# Patient Record
Sex: Male | Born: 1975
Health system: Southern US, Community
[De-identification: ages and names within clinical notes are randomized; demographics above are authoritative.]

## PROBLEM LIST (undated history)

## (undated) DIAGNOSIS — M4310 Spondylolisthesis, site unspecified: Secondary | ICD-10-CM

## (undated) DIAGNOSIS — K219 Gastro-esophageal reflux disease without esophagitis: Secondary | ICD-10-CM

## (undated) DIAGNOSIS — F32A Depression, unspecified: Secondary | ICD-10-CM

## (undated) DIAGNOSIS — Z9989 Dependence on other enabling machines and devices: Secondary | ICD-10-CM

## (undated) DIAGNOSIS — M1990 Unspecified osteoarthritis, unspecified site: Secondary | ICD-10-CM

## (undated) DIAGNOSIS — I10 Essential (primary) hypertension: Secondary | ICD-10-CM

## (undated) DIAGNOSIS — G4733 Obstructive sleep apnea (adult) (pediatric): Secondary | ICD-10-CM

## (undated) DIAGNOSIS — F329 Major depressive disorder, single episode, unspecified: Secondary | ICD-10-CM

## (undated) HISTORY — PX: KNEE ARTHROSCOPY: SHX127

## (undated) HISTORY — PX: WISDOM TOOTH EXTRACTION: SHX21

## (undated) HISTORY — PX: TONSILLECTOMY: SUR1361

## (undated) HISTORY — DX: Spondylolisthesis, site unspecified: M43.10

## (undated) HISTORY — PX: MULTIPLE TOOTH EXTRACTIONS: SHX2053

## (undated) HISTORY — PX: OTHER SURGICAL HISTORY: SHX169

## (undated) HISTORY — DX: Obstructive sleep apnea (adult) (pediatric): G47.33

## (undated) HISTORY — DX: Gastro-esophageal reflux disease without esophagitis: K21.9

## (undated) HISTORY — DX: Essential (primary) hypertension: I10

## (undated) HISTORY — PX: VARICOCELE EXCISION: SUR582

## (undated) HISTORY — PX: CERVICAL SPINE SURGERY: SHX589

## (undated) HISTORY — PX: NASAL SEPTUM SURGERY: SHX37

## (undated) HISTORY — DX: Dependence on other enabling machines and devices: Z99.89

## (undated) HISTORY — PX: BACK SURGERY: SHX140

---

## 2000-02-26 ENCOUNTER — Ambulatory Visit: Admission: RE | Admit: 2000-02-26 | Discharge: 2000-02-26 | Payer: Self-pay | Admitting: Internal Medicine

## 2001-01-26 ENCOUNTER — Other Ambulatory Visit: Admission: RE | Admit: 2001-01-26 | Discharge: 2001-01-26 | Payer: Self-pay | Admitting: Otolaryngology

## 2003-10-01 ENCOUNTER — Ambulatory Visit (HOSPITAL_BASED_OUTPATIENT_CLINIC_OR_DEPARTMENT_OTHER): Admission: RE | Admit: 2003-10-01 | Discharge: 2003-10-01 | Payer: Self-pay | Admitting: Otolaryngology

## 2003-11-07 ENCOUNTER — Ambulatory Visit (HOSPITAL_COMMUNITY): Admission: RE | Admit: 2003-11-07 | Discharge: 2003-11-08 | Payer: Self-pay | Admitting: Otolaryngology

## 2005-03-16 ENCOUNTER — Ambulatory Visit (HOSPITAL_BASED_OUTPATIENT_CLINIC_OR_DEPARTMENT_OTHER): Admission: RE | Admit: 2005-03-16 | Discharge: 2005-03-16 | Payer: Self-pay | Admitting: Family Medicine

## 2005-03-21 ENCOUNTER — Ambulatory Visit: Payer: Self-pay | Admitting: Internal Medicine

## 2005-05-07 ENCOUNTER — Ambulatory Visit: Payer: Self-pay | Admitting: Pulmonary Disease

## 2005-05-18 ENCOUNTER — Ambulatory Visit (HOSPITAL_BASED_OUTPATIENT_CLINIC_OR_DEPARTMENT_OTHER): Admission: RE | Admit: 2005-05-18 | Discharge: 2005-05-18 | Payer: Self-pay | Admitting: Pulmonary Disease

## 2005-05-21 ENCOUNTER — Ambulatory Visit: Payer: Self-pay | Admitting: Pulmonary Disease

## 2005-07-20 ENCOUNTER — Ambulatory Visit: Payer: Self-pay | Admitting: Internal Medicine

## 2012-05-29 ENCOUNTER — Other Ambulatory Visit: Payer: Self-pay | Admitting: Occupational Medicine

## 2012-05-29 ENCOUNTER — Ambulatory Visit (HOSPITAL_BASED_OUTPATIENT_CLINIC_OR_DEPARTMENT_OTHER)
Admission: RE | Admit: 2012-05-29 | Discharge: 2012-05-29 | Disposition: A | Discharge status: Home / Self Care | Payer: Self-pay | Source: Ambulatory Visit | Attending: Occupational Medicine | Admitting: Occupational Medicine

## 2012-05-29 DIAGNOSIS — Z0000 Encounter for general adult medical examination without abnormal findings: Secondary | ICD-10-CM

## 2014-05-11 ENCOUNTER — Encounter: Payer: Self-pay | Admitting: *Deleted

## 2018-01-02 ENCOUNTER — Other Ambulatory Visit: Payer: Self-pay | Admitting: Orthopedic Surgery

## 2018-01-03 ENCOUNTER — Other Ambulatory Visit: Payer: Self-pay

## 2018-01-03 ENCOUNTER — Encounter (HOSPITAL_COMMUNITY): Payer: Self-pay | Admitting: *Deleted

## 2018-01-03 NOTE — Progress Notes (Signed)
Spoke with pt for pre-op call. Pt denies cardiac history or diabetes. States he had HTN "years ago" but no longer on medications.

## 2018-01-03 NOTE — Pre-Procedure Instructions (Addendum)
Juan Long  01/03/2018      [facility] - [place] - [address] [address] [place] [address] Phone: [phone] Fax: [phone]    Your procedure is scheduled on Thursday 01/05/2018.  Report to [facility] Admitting at 0530 A.M.  Call this number if you have problems the morning of surgery:  [phone]   Remember:  Do not eat food or drink liquids after midnight.  Take these medicines the morning of surgery with A SIP OF WATER: Bupropion (Wellbutrin XL) Escitalopram (Lexapro)  7 days prior to surgery STOP taking any Aspirin (unless otherwise instructed by your surgeon), Aleve, Naproxen, Ibuprofen, Motrin, Advil, Goody's, BC's, all herbal medications, fish oil, and all vitamins    Do not wear jewelry.  Do not wear lotions, powders, or colognes, or deodorant.  Men may shave face and neck.    Do not bring valuables to the hospital.  [facility] is not responsible for any belongings or valuables.  Hearing aids, eyeglasses, contacts, dentures or bridgework may not be worn into surgery.  Leave your suitcase in the car.  After surgery it may be brought to your room.  For patients admitted to the hospital, discharge time will be determined by your treatment team.  Patients discharged the day of surgery will not be allowed to drive home.   Name and phone number of your driver:    Special instructions:   Juan Long- Preparing For Surgery  Before surgery, you can play an important role. Because skin is not sterile, your skin needs to be as free of germs as possible. You can reduce the number of germs on your skin by washing with CHG (chlorahexidine gluconate) Soap before surgery.  CHG is an antiseptic cleaner which kills germs and bonds with the skin to continue killing germs even after washing.  Please do not use if you have an allergy to CHG or antibacterial soaps. If your skin becomes  reddened/irritated stop using the CHG.  Do not shave (including legs and underarms) for at least 48 hours prior to first CHG shower. It is OK to shave your face.  Please follow these instructions carefully.   1. Shower the NIGHT BEFORE SURGERY and the MORNING OF SURGERY with CHG.   2. If you chose to wash your hair, wash your hair first as usual with your normal shampoo.  3. After you shampoo, rinse your hair and body thoroughly to remove the shampoo.  4. Use CHG as you would any other liquid soap. You can apply CHG directly to the skin and wash gently with a scrungie or a clean washcloth.   5. Apply the CHG Soap to your body ONLY FROM THE NECK DOWN.  Do not use on open wounds or open sores. Avoid contact with your eyes, ears, mouth and genitals (private parts). Wash Face and genitals (private parts)  with your normal soap.  6. Wash thoroughly, paying special attention to the area where your surgery will be performed.  7. Thoroughly rinse your body with warm water from the neck down.  8. DO NOT shower/wash with your normal soap after using and rinsing off the CHG Soap.  9. Pat yourself dry with a CLEAN TOWEL.  10. Wear CLEAN PAJAMAS to bed the night before surgery, wear comfortable clothes the morning of surgery  11. Place CLEAN SHEETS on your bed the night of your first shower and DO NOT SLEEP WITH  PETS.    Day of Surgery: Shower as stated above. Do not apply any deodorants/lotions. Please wear clean clothes to the hospital/surgery center.      Please read over the following fact sheets that you were given.

## 2018-01-04 ENCOUNTER — Observation Stay (HOSPITAL_COMMUNITY)
Admission: RE | Admit: 2018-01-04 | Discharge: 2018-01-05 | Disposition: A | Discharge status: Home / Self Care | Payer: No Typology Code available for payment source | Source: Ambulatory Visit | Attending: Orthopedic Surgery | Admitting: Orthopedic Surgery

## 2018-01-04 ENCOUNTER — Inpatient Hospital Stay (HOSPITAL_COMMUNITY): Payer: No Typology Code available for payment source

## 2018-01-04 ENCOUNTER — Inpatient Hospital Stay (HOSPITAL_COMMUNITY): Payer: No Typology Code available for payment source | Admitting: Anesthesiology

## 2018-01-04 ENCOUNTER — Ambulatory Visit (HOSPITAL_COMMUNITY)
Admission: RE | Disposition: A | Discharge status: Home / Self Care | Payer: Self-pay | Source: Ambulatory Visit | Attending: Orthopedic Surgery

## 2018-01-04 ENCOUNTER — Inpatient Hospital Stay (HOSPITAL_COMMUNITY)
Admission: RE | Admit: 2018-01-04 | Discharge: 2018-01-04 | Disposition: A | Discharge status: Home / Self Care | Payer: Self-pay | Source: Ambulatory Visit

## 2018-01-04 ENCOUNTER — Encounter (HOSPITAL_COMMUNITY): Payer: Self-pay | Admitting: *Deleted

## 2018-01-04 DIAGNOSIS — K219 Gastro-esophageal reflux disease without esophagitis: Secondary | ICD-10-CM | POA: Insufficient documentation

## 2018-01-04 DIAGNOSIS — Z79899 Other long term (current) drug therapy: Secondary | ICD-10-CM | POA: Diagnosis not present

## 2018-01-04 DIAGNOSIS — I10 Essential (primary) hypertension: Secondary | ICD-10-CM | POA: Insufficient documentation

## 2018-01-04 DIAGNOSIS — G9529 Other cord compression: Secondary | ICD-10-CM | POA: Insufficient documentation

## 2018-01-04 DIAGNOSIS — Z419 Encounter for procedure for purposes other than remedying health state, unspecified: Secondary | ICD-10-CM

## 2018-01-04 DIAGNOSIS — Z9989 Dependence on other enabling machines and devices: Secondary | ICD-10-CM | POA: Diagnosis not present

## 2018-01-04 DIAGNOSIS — F329 Major depressive disorder, single episode, unspecified: Secondary | ICD-10-CM | POA: Insufficient documentation

## 2018-01-04 DIAGNOSIS — M4802 Spinal stenosis, cervical region: Secondary | ICD-10-CM | POA: Diagnosis not present

## 2018-01-04 DIAGNOSIS — M5412 Radiculopathy, cervical region: Secondary | ICD-10-CM | POA: Diagnosis not present

## 2018-01-04 DIAGNOSIS — Z87891 Personal history of nicotine dependence: Secondary | ICD-10-CM | POA: Diagnosis not present

## 2018-01-04 DIAGNOSIS — Z01818 Encounter for other preprocedural examination: Secondary | ICD-10-CM

## 2018-01-04 DIAGNOSIS — G4733 Obstructive sleep apnea (adult) (pediatric): Secondary | ICD-10-CM | POA: Diagnosis not present

## 2018-01-04 DIAGNOSIS — M1990 Unspecified osteoarthritis, unspecified site: Secondary | ICD-10-CM | POA: Diagnosis not present

## 2018-01-04 DIAGNOSIS — M5410 Radiculopathy, site unspecified: Secondary | ICD-10-CM | POA: Diagnosis present

## 2018-01-04 HISTORY — PX: ANTERIOR CERVICAL DECOMP/DISCECTOMY FUSION: SHX1161

## 2018-01-04 HISTORY — DX: Major depressive disorder, single episode, unspecified: F32.9

## 2018-01-04 HISTORY — DX: Depression, unspecified: F32.A

## 2018-01-04 LAB — COMPREHENSIVE METABOLIC PANEL
ALT: 91 U/L — ABNORMAL HIGH (ref 17–63)
AST: 41 U/L (ref 15–41)
Albumin: 4 g/dL (ref 3.5–5.0)
Alkaline Phosphatase: 70 U/L (ref 38–126)
Anion gap: 10 (ref 5–15)
BUN: 12 mg/dL (ref 6–20)
CO2: 20 mmol/L — ABNORMAL LOW (ref 22–32)
Calcium: 9.1 mg/dL (ref 8.9–10.3)
Chloride: 107 mmol/L (ref 101–111)
Creatinine, Ser: 0.97 mg/dL (ref 0.61–1.24)
GFR calc Af Amer: >60 mL/min (ref >60)
GFR calc non Af Amer: >60 mL/min (ref >60)
Glucose, Bld: 96 mg/dL (ref 65–99)
Potassium: 4.1 mmol/L (ref 3.5–5.1)
Sodium: 137 mmol/L (ref 135–145)
Total Bilirubin: 0.5 mg/dL (ref 0.3–1.2)
Total Protein: 6.4 g/dL — ABNORMAL LOW (ref 6.5–8.1)

## 2018-01-04 LAB — URINALYSIS, ROUTINE W REFLEX MICROSCOPIC
Bilirubin Urine: NEGATIVE
Glucose, UA: NEGATIVE mg/dL
Hgb urine dipstick: NEGATIVE
Ketones, ur: NEGATIVE mg/dL
Leukocytes, UA: NEGATIVE
Nitrite: NEGATIVE
Protein, ur: NEGATIVE mg/dL
Specific Gravity, Urine: 1.014 (ref 1.005–1.030)
pH: 6 (ref 5.0–8.0)

## 2018-01-04 LAB — PROTIME-INR
INR: 0.92
Prothrombin Time: 12.3 seconds (ref 11.4–15.2)

## 2018-01-04 LAB — CBC WITH DIFFERENTIAL/PLATELET
Basophils Absolute: 0 10*3/uL (ref 0.0–0.1)
Basophils Relative: 0 %
Eosinophils Absolute: 0.1 10*3/uL (ref 0.0–0.7)
Eosinophils Relative: 2 %
HCT: 43.3 % (ref 39.0–52.0)
Hemoglobin: 14.7 g/dL (ref 13.0–17.0)
Lymphocytes Relative: 37 %
Lymphs Abs: 1.8 10*3/uL (ref 0.7–4.0)
MCH: 30.7 pg (ref 26.0–34.0)
MCHC: 33.9 g/dL (ref 30.0–36.0)
MCV: 90.4 fL (ref 78.0–100.0)
Monocytes Absolute: 0.3 10*3/uL (ref 0.1–1.0)
Monocytes Relative: 6 %
Neutro Abs: 2.7 10*3/uL (ref 1.7–7.7)
Neutrophils Relative %: 55 %
Platelets: 316 10*3/uL (ref 150–400)
RBC: 4.79 MIL/uL (ref 4.22–5.81)
RDW: 13.8 % (ref 11.5–15.5)
WBC: 4.8 10*3/uL (ref 4.0–10.5)

## 2018-01-04 LAB — APTT: aPTT: 32 seconds (ref 24–36)

## 2018-01-04 SURGERY — ANTERIOR CERVICAL DECOMPRESSION/DISCECTOMY FUSION 2 LEVELS
Anesthesia: General | Laterality: Right

## 2018-01-04 MED ORDER — POVIDONE-IODINE 7.5 % EX SOLN: Freq: Once | CUTANEOUS | Status: DC

## 2018-01-04 MED ORDER — BUPROPION HCL ER (XL) 150 MG PO TB24
450.0000 mg | ORAL_TABLET | Freq: Every day | ORAL | Status: DC
  Filled 2018-01-04: qty 1

## 2018-01-04 MED ORDER — FENTANYL CITRATE (PF) 100 MCG/2ML IJ SOLN
INTRAMUSCULAR | Status: DC | PRN
  Administered 2018-01-04: 100 ug via INTRAVENOUS
  Administered 2018-01-04: 50 ug via INTRAVENOUS
  Administered 2018-01-04: 100 ug via INTRAVENOUS
  Administered 2018-01-04 (×2): 50 ug via INTRAVENOUS

## 2018-01-04 MED ORDER — PANTOPRAZOLE SODIUM 20 MG PO TBEC
20.0000 mg | DELAYED_RELEASE_TABLET | Freq: Every day | ORAL | Status: DC
  Administered 2018-01-04 – 2018-01-05 (×2): 20 mg via ORAL
  Filled 2018-01-04 (×2): qty 1

## 2018-01-04 MED ORDER — ONDANSETRON HCL 4 MG/2ML IJ SOLN: 4.0000 mg | Freq: Four times a day (QID) | INTRAMUSCULAR | Status: DC | PRN

## 2018-01-04 MED ORDER — PROPOFOL 10 MG/ML IV BOLUS
INTRAVENOUS | Status: DC | PRN
  Administered 2018-01-04: 200 mg via INTRAVENOUS

## 2018-01-04 MED ORDER — 0.9 % SODIUM CHLORIDE (POUR BTL) OPTIME
TOPICAL | Status: DC | PRN
  Administered 2018-01-04: 1000 mL

## 2018-01-04 MED ORDER — ACETAMINOPHEN 650 MG RE SUPP: 650.0000 mg | RECTAL | Status: DC | PRN

## 2018-01-04 MED ORDER — THROMBIN 5000 UNITS EX SOLR
CUTANEOUS | Status: DC | PRN
  Administered 2018-01-04: 5000 [IU] via TOPICAL

## 2018-01-04 MED ORDER — ONDANSETRON HCL 4 MG/2ML IJ SOLN
INTRAMUSCULAR | Status: AC
  Filled 2018-01-04: qty 2

## 2018-01-04 MED ORDER — LIDOCAINE HCL (CARDIAC) PF 100 MG/5ML IV SOSY
PREFILLED_SYRINGE | INTRAVENOUS | Status: DC | PRN
  Administered 2018-01-04: 100 mg via INTRAVENOUS

## 2018-01-04 MED ORDER — EPHEDRINE 5 MG/ML INJ
INTRAVENOUS | Status: AC
  Filled 2018-01-04: qty 10

## 2018-01-04 MED ORDER — DOCUSATE SODIUM 100 MG PO CAPS
100.0000 mg | ORAL_CAPSULE | Freq: Two times a day (BID) | ORAL | Status: DC
  Administered 2018-01-04 – 2018-01-05 (×2): 100 mg via ORAL
  Filled 2018-01-04 (×2): qty 1

## 2018-01-04 MED ORDER — THROMBIN 5000 UNITS EX SOLR
CUTANEOUS | Status: AC
  Filled 2018-01-04: qty 5000

## 2018-01-04 MED ORDER — FENTANYL CITRATE (PF) 250 MCG/5ML IJ SOLN
INTRAMUSCULAR | Status: AC
  Filled 2018-01-04: qty 5

## 2018-01-04 MED ORDER — BUPIVACAINE-EPINEPHRINE (PF) 0.25% -1:200000 IJ SOLN
INTRAMUSCULAR | Status: AC
  Filled 2018-01-04: qty 30

## 2018-01-04 MED ORDER — DEXAMETHASONE SODIUM PHOSPHATE 10 MG/ML IJ SOLN
INTRAMUSCULAR | Status: AC
  Filled 2018-01-04: qty 1

## 2018-01-04 MED ORDER — MENTHOL 3 MG MT LOZG: 1.0000 | LOZENGE | OROMUCOSAL | Status: DC | PRN

## 2018-01-04 MED ORDER — LACTATED RINGERS IV SOLN
INTRAVENOUS | Status: DC
  Administered 2018-01-04 (×2): via INTRAVENOUS

## 2018-01-04 MED ORDER — SUGAMMADEX SODIUM 200 MG/2ML IV SOLN
INTRAVENOUS | Status: AC
  Filled 2018-01-04: qty 2

## 2018-01-04 MED ORDER — FLEET ENEMA 7-19 GM/118ML RE ENEM: 1.0000 | ENEMA | Freq: Once | RECTAL | Status: DC | PRN

## 2018-01-04 MED ORDER — PHENOL 1.4 % MT LIQD: 1.0000 | OROMUCOSAL | Status: DC | PRN

## 2018-01-04 MED ORDER — SUGAMMADEX SODIUM 200 MG/2ML IV SOLN
INTRAVENOUS | Status: DC | PRN
  Administered 2018-01-04: 200 mg via INTRAVENOUS

## 2018-01-04 MED ORDER — SODIUM CHLORIDE 0.9% FLUSH: 3.0000 mL | INTRAVENOUS | Status: DC | PRN

## 2018-01-04 MED ORDER — THROMBIN (RECOMBINANT) 20000 UNITS EX SOLR
CUTANEOUS | Status: DC | PRN
  Administered 2018-01-04: 12:00:00 via TOPICAL

## 2018-01-04 MED ORDER — MEPERIDINE HCL 50 MG/ML IJ SOLN: 6.2500 mg | INTRAMUSCULAR | Status: DC | PRN

## 2018-01-04 MED ORDER — DEXAMETHASONE SODIUM PHOSPHATE 10 MG/ML IJ SOLN
INTRAMUSCULAR | Status: DC | PRN
  Administered 2018-01-04: 5 mg via INTRAVENOUS

## 2018-01-04 MED ORDER — EPHEDRINE SULFATE-NACL 50-0.9 MG/10ML-% IV SOSY
PREFILLED_SYRINGE | INTRAVENOUS | Status: DC | PRN
  Administered 2018-01-04: 5 mg via INTRAVENOUS

## 2018-01-04 MED ORDER — CEFAZOLIN SODIUM-DEXTROSE 2-4 GM/100ML-% IV SOLN
2.0000 g | INTRAVENOUS | Status: AC
  Administered 2018-01-04: 2 g via INTRAVENOUS
  Filled 2018-01-04: qty 100

## 2018-01-04 MED ORDER — ACETAMINOPHEN 325 MG PO TABS: 650.0000 mg | ORAL_TABLET | ORAL | Status: DC | PRN

## 2018-01-04 MED ORDER — ESCITALOPRAM OXALATE 10 MG PO TABS
10.0000 mg | ORAL_TABLET | Freq: Every day | ORAL | Status: DC
  Filled 2018-01-04: qty 1

## 2018-01-04 MED ORDER — ALUM & MAG HYDROXIDE-SIMETH 200-200-20 MG/5ML PO SUSP: 30.0000 mL | Freq: Four times a day (QID) | ORAL | Status: DC | PRN

## 2018-01-04 MED ORDER — MIDAZOLAM HCL 5 MG/5ML IJ SOLN
INTRAMUSCULAR | Status: DC | PRN
  Administered 2018-01-04: 2 mg via INTRAVENOUS

## 2018-01-04 MED ORDER — SENNOSIDES-DOCUSATE SODIUM 8.6-50 MG PO TABS: 1.0000 | ORAL_TABLET | Freq: Every evening | ORAL | Status: DC | PRN

## 2018-01-04 MED ORDER — PHENYLEPHRINE HCL 10 MG/ML IJ SOLN
INTRAVENOUS | Status: DC | PRN
  Administered 2018-01-04: 15 ug/min via INTRAVENOUS

## 2018-01-04 MED ORDER — LIDOCAINE 2% (20 MG/ML) 5 ML SYRINGE
INTRAMUSCULAR | Status: AC
  Filled 2018-01-04: qty 5

## 2018-01-04 MED ORDER — BUPIVACAINE-EPINEPHRINE 0.25% -1:200000 IJ SOLN
INTRAMUSCULAR | Status: DC | PRN
  Administered 2018-01-04: 6 mL

## 2018-01-04 MED ORDER — ONDANSETRON HCL 4 MG/2ML IJ SOLN
INTRAMUSCULAR | Status: DC | PRN
  Administered 2018-01-04: 4 mg via INTRAVENOUS

## 2018-01-04 MED ORDER — PROMETHAZINE HCL 25 MG/ML IJ SOLN: 6.2500 mg | INTRAMUSCULAR | Status: DC | PRN

## 2018-01-04 MED ORDER — OXYCODONE-ACETAMINOPHEN 5-325 MG PO TABS
1.0000 | ORAL_TABLET | ORAL | Status: DC | PRN
  Administered 2018-01-04 – 2018-01-05 (×4): 2 via ORAL
  Filled 2018-01-04 (×4): qty 2

## 2018-01-04 MED ORDER — SODIUM CHLORIDE 0.9% FLUSH: 3.0000 mL | Freq: Two times a day (BID) | INTRAVENOUS | Status: DC

## 2018-01-04 MED ORDER — BISACODYL 5 MG PO TBEC: 5.0000 mg | DELAYED_RELEASE_TABLET | Freq: Every day | ORAL | Status: DC | PRN

## 2018-01-04 MED ORDER — ZOLPIDEM TARTRATE 5 MG PO TABS: 5.0000 mg | ORAL_TABLET | Freq: Every evening | ORAL | Status: DC | PRN

## 2018-01-04 MED ORDER — CEFAZOLIN SODIUM-DEXTROSE 2-4 GM/100ML-% IV SOLN
2.0000 g | Freq: Three times a day (TID) | INTRAVENOUS | Status: AC
  Administered 2018-01-04 – 2018-01-05 (×2): 2 g via INTRAVENOUS
  Filled 2018-01-04 (×2): qty 100

## 2018-01-04 MED ORDER — THROMBIN 20000 UNITS EX SOLR
CUTANEOUS | Status: AC
  Filled 2018-01-04: qty 20000

## 2018-01-04 MED ORDER — HYDROMORPHONE HCL 2 MG/ML IJ SOLN: 0.3000 mg | INTRAMUSCULAR | Status: DC | PRN

## 2018-01-04 MED ORDER — DIAZEPAM 5 MG PO TABS
5.0000 mg | ORAL_TABLET | Freq: Four times a day (QID) | ORAL | Status: DC | PRN
  Administered 2018-01-04 – 2018-01-05 (×2): 5 mg via ORAL
  Filled 2018-01-04 (×2): qty 1

## 2018-01-04 MED ORDER — ACETAMINOPHEN 10 MG/ML IV SOLN: 1000.0000 mg | Freq: Once | INTRAVENOUS | Status: DC | PRN

## 2018-01-04 MED ORDER — HYDROCODONE-ACETAMINOPHEN 7.5-325 MG PO TABS: 1.0000 | ORAL_TABLET | Freq: Once | ORAL | Status: DC | PRN

## 2018-01-04 MED ORDER — ONDANSETRON HCL 4 MG PO TABS: 4.0000 mg | ORAL_TABLET | Freq: Four times a day (QID) | ORAL | Status: DC | PRN

## 2018-01-04 MED ORDER — ROCURONIUM BROMIDE 100 MG/10ML IV SOLN
INTRAVENOUS | Status: DC | PRN
  Administered 2018-01-04: 10 mg via INTRAVENOUS
  Administered 2018-01-04: 50 mg via INTRAVENOUS

## 2018-01-04 SURGICAL SUPPLY — 76 items
BENZOIN TINCTURE PRP APPL 2/3 (GAUZE/BANDAGES/DRESSINGS) ×2 IMPLANT
BIT DRILL NEURO 2X3.1 SFT TUCH (MISCELLANEOUS) ×1 IMPLANT
BIT DRILL SRG 14X2.2XFLT CHK (BIT) ×1 IMPLANT
BIT DRL SRG 14X2.2XFLT CHK (BIT) ×1
BLADE CLIPPER SURG (BLADE) ×2 IMPLANT
BLADE SURG 15 STRL LF DISP TIS (BLADE) ×1 IMPLANT
BLADE SURG 15 STRL SS (BLADE) ×1
BONE VIVIGEN FORMABLE 1.3CC (Bone Implant) ×2 IMPLANT
BUR MATCHSTICK NEURO 3.0 LAGG (BURR) IMPLANT
CARTRIDGE OIL MAESTRO DRILL (MISCELLANEOUS) ×1 IMPLANT
COLLAR CERV LO CONTOUR FIRM DE (SOFTGOODS) IMPLANT
CORDS BIPOLAR (ELECTRODE) ×2 IMPLANT
COVER SURGICAL LIGHT HANDLE (MISCELLANEOUS) ×2 IMPLANT
CRADLE DONUT ADULT HEAD (MISCELLANEOUS) IMPLANT
DECANTER SPIKE VIAL GLASS SM (MISCELLANEOUS) ×2 IMPLANT
DERMABOND ADVANCED (GAUZE/BANDAGES/DRESSINGS) ×1
DERMABOND ADVANCED .7 DNX12 (GAUZE/BANDAGES/DRESSINGS) ×1 IMPLANT
DIFFUSER DRILL AIR PNEUMATIC (MISCELLANEOUS) ×2 IMPLANT
DRAIN JACKSON RD 7FR 3/32 (WOUND CARE) IMPLANT
DRAPE C-ARM 42X72 X-RAY (DRAPES) ×4 IMPLANT
DRAPE POUCH INSTRU U-SHP 10X18 (DRAPES) ×2 IMPLANT
DRAPE SURG 17X23 STRL (DRAPES) ×8 IMPLANT
DRILL BIT SKYLINE 14MM (BIT) ×1
DRILL NEURO 2X3.1 SOFT TOUCH (MISCELLANEOUS) ×2
DURAPREP 26ML APPLICATOR (WOUND CARE) ×2 IMPLANT
ELECT COATED BLADE 2.86 ST (ELECTRODE) ×2 IMPLANT
ELECT REM PT RETURN 9FT ADLT (ELECTROSURGICAL) ×2
ELECTRODE REM PT RTRN 9FT ADLT (ELECTROSURGICAL) ×1 IMPLANT
EVACUATOR SILICONE 100CC (DRAIN) IMPLANT
GAUZE SPONGE 4X4 12PLY STRL (GAUZE/BANDAGES/DRESSINGS) ×2 IMPLANT
GAUZE SPONGE 4X4 16PLY XRAY LF (GAUZE/BANDAGES/DRESSINGS) ×2 IMPLANT
GLOVE BIO SURGEON STRL SZ7 (GLOVE) ×2 IMPLANT
GLOVE BIO SURGEON STRL SZ8 (GLOVE) ×2 IMPLANT
GLOVE BIOGEL PI IND STRL 7.0 (GLOVE) ×2 IMPLANT
GLOVE BIOGEL PI IND STRL 8 (GLOVE) ×1 IMPLANT
GLOVE BIOGEL PI INDICATOR 7.0 (GLOVE) ×2
GLOVE BIOGEL PI INDICATOR 8 (GLOVE) ×1
GOWN STRL REUS W/ TWL LRG LVL3 (GOWN DISPOSABLE) ×5 IMPLANT
GOWN STRL REUS W/ TWL XL LVL3 (GOWN DISPOSABLE) ×1 IMPLANT
GOWN STRL REUS W/TWL LRG LVL3 (GOWN DISPOSABLE) ×5
GOWN STRL REUS W/TWL XL LVL3 (GOWN DISPOSABLE) ×1
INTERLOCK LRDTC CRVCL VBR 7MM (Bone Implant) ×2 IMPLANT
IV CATH 14GX2 1/4 (CATHETERS) ×2 IMPLANT
KIT BASIN OR (CUSTOM PROCEDURE TRAY) ×2 IMPLANT
KIT TURNOVER KIT B (KITS) ×2 IMPLANT
LORDOTIC CERVICAL VBR 7MM SM (Bone Implant) ×4 IMPLANT
MANIFOLD NEPTUNE II (INSTRUMENTS) ×2 IMPLANT
NEEDLE PRECISIONGLIDE 27X1.5 (NEEDLE) ×2 IMPLANT
NEEDLE SPNL 20GX3.5 QUINCKE YW (NEEDLE) ×2 IMPLANT
NS IRRIG 1000ML POUR BTL (IV SOLUTION) ×2 IMPLANT
OIL CARTRIDGE MAESTRO DRILL (MISCELLANEOUS) ×2
PACK ORTHO CERVICAL (CUSTOM PROCEDURE TRAY) ×2 IMPLANT
PAD ARMBOARD 7.5X6 YLW CONV (MISCELLANEOUS) ×4 IMPLANT
PATTIES SURGICAL .5 X.5 (GAUZE/BANDAGES/DRESSINGS) IMPLANT
PATTIES SURGICAL .5 X1 (DISPOSABLE) ×2 IMPLANT
PIN DISTRACTION 14 (PIN) ×4 IMPLANT
PLATE TWO LEVEL SKYLINE 30MM (Plate) ×2 IMPLANT
SCREW SKYLINE VAR OS 14MM (Screw) ×12 IMPLANT
SPONGE INTESTINAL PEANUT (DISPOSABLE) ×10 IMPLANT
SPONGE LAP 4X18 X RAY DECT (DISPOSABLE) ×2 IMPLANT
SPONGE SURGIFOAM ABS GEL 100 (HEMOSTASIS) ×2 IMPLANT
STRIP CLOSURE SKIN 1/2X4 (GAUZE/BANDAGES/DRESSINGS) ×2 IMPLANT
SUT MNCRL AB 4-0 PS2 18 (SUTURE) ×2 IMPLANT
SUT SILK 4 0 (SUTURE)
SUT SILK 4-0 18XBRD TIE 12 (SUTURE) IMPLANT
SUT VIC AB 2-0 CT2 18 VCP726D (SUTURE) ×2 IMPLANT
SYR BULB IRRIGATION 50ML (SYRINGE) ×2 IMPLANT
SYR CONTROL 10ML LL (SYRINGE) ×6 IMPLANT
TAPE CLOTH 4X10 WHT NS (GAUZE/BANDAGES/DRESSINGS) ×2 IMPLANT
TAPE CLOTH SURG 4X10 WHT LF (GAUZE/BANDAGES/DRESSINGS) ×2 IMPLANT
TAPE UMBILICAL COTTON 1/8X30 (MISCELLANEOUS) ×2 IMPLANT
TOWEL OR 17X24 6PK STRL BLUE (TOWEL DISPOSABLE) ×2 IMPLANT
TOWEL OR 17X26 10 PK STRL BLUE (TOWEL DISPOSABLE) ×2 IMPLANT
TUBE CONNECTING 12X1/4 (SUCTIONS) ×2 IMPLANT
WATER STERILE IRR 1000ML POUR (IV SOLUTION) ×2 IMPLANT
YANKAUER SUCT BULB TIP NO VENT (SUCTIONS) ×4 IMPLANT

## 2018-01-04 NOTE — Anesthesia Postprocedure Evaluation (Signed)
Anesthesia Post Note  Patient: Andria Rheinatrick R Bozman  Procedure(s) Performed: ANTERIOR CERVICAL DECOMPRESSION FUSION CERVICAL FIVE-SIX, CERVICAL SIX-SEVEN WITH INSTRUMENTATION AND ALLOGRAFT (Right )     Patient location during evaluation: PACU Anesthesia Type: General Level of consciousness: awake and alert Pain management: pain level controlled Vital Signs Assessment: post-procedure vital signs reviewed and stable Respiratory status: spontaneous breathing, nonlabored ventilation, respiratory function stable and patient connected to nasal cannula oxygen Cardiovascular status: blood pressure returned to baseline and stable Postop Assessment: no apparent nausea or vomiting Anesthetic complications: no    Last Vitals:  Vitals:   01/04/18 1545 01/04/18 1550  BP: (!) 140/96 (!) 148/85  Pulse: 77 73  Resp: 13 11  Temp:  36.9 C  SpO2: 97% 97%    Last Pain:  Vitals:   01/04/18 1550  TempSrc:   PainSc: 0-No pain                 Trevor IhaStephen A Teandre Hamre

## 2018-01-04 NOTE — Anesthesia Preprocedure Evaluation (Addendum)
Anesthesia Evaluation  Patient identified by MRN, date of birth, ID band Patient awake    Reviewed: Allergy & Precautions, NPO status , Patient's Chart, lab work & pertinent test results  Airway Mallampati: II  TM Distance: >3 FB Neck ROM: Full    Dental no notable dental hx. (+) Teeth Intact, Dental Advisory Given   Pulmonary sleep apnea and Continuous Positive Airway Pressure Ventilation , former smoker,    Pulmonary exam normal breath sounds clear to auscultation       Cardiovascular Exercise Tolerance: Good hypertension, Pt. on medications Normal cardiovascular exam Rhythm:Regular Rate:Normal     Neuro/Psych negative neurological ROS     GI/Hepatic negative GI ROS, Neg liver ROS,   Endo/Other  negative endocrine ROS  Renal/GU negative Renal ROS     Musculoskeletal  (+) Arthritis ,   Abdominal   Peds negative pediatric ROS (+)  Hematology negative hematology ROS (+)   Anesthesia Other Findings   Reproductive/Obstetrics                           Anesthesia Physical Anesthesia Plan  ASA: II  Anesthesia Plan: General   Post-op Pain Management:    Induction: Intravenous  PONV Risk Score and Plan: Treatment may vary due to age or medical condition  Airway Management Planned: Oral ETT  Additional Equipment:   Intra-op Plan:   Post-operative Plan: Extubation in OR  Informed Consent: I have reviewed the patients History and Physical, chart, labs and discussed the procedure including the risks, benefits and alternatives for the proposed anesthesia with the patient or authorized representative who has indicated his/her understanding and acceptance.   Dental advisory given  Plan Discussed with:   Anesthesia Plan Comments:        Anesthesia Quick Evaluation

## 2018-01-04 NOTE — Progress Notes (Signed)
Pt CPAP set up for the night.  Pt using a full face mask.  Instructed pt on how to don and remove mask.  Pt will call if he has any questions.  No humidity per patient request as he does not use humidity at home.

## 2018-01-04 NOTE — Transfer of Care (Signed)
Immediate Anesthesia Transfer of Care Note  Patient: Juan Long  Procedure(s) Performed: ANTERIOR CERVICAL DECOMPRESSION FUSION CERVICAL FIVE-SIX, CERVICAL SIX-SEVEN WITH INSTRUMENTATION AND ALLOGRAFT (Right )  Patient Location: PACU  Anesthesia Type:General  Level of Consciousness: awake, alert , oriented and patient cooperative  Airway & Oxygen Therapy: Patient Spontanous Breathing and Patient connected to nasal cannula oxygen  Post-op Assessment: Report given to RN and Post -op Vital signs reviewed and stable  Post vital signs: Reviewed and stable  Last Vitals:  Vitals Value Taken Time  BP 149/97 01/04/2018  3:27 PM  Temp 36.9 C 01/04/2018  3:26 PM  Pulse 87 01/04/2018  3:27 PM  Resp 1 01/04/2018  3:27 PM  SpO2 96 % 01/04/2018  3:27 PM  Vitals shown include unvalidated device data.  Last Pain:  Vitals:   01/04/18 0805  TempSrc: Oral         Complications: No apparent anesthesia complications

## 2018-01-04 NOTE — Progress Notes (Signed)
Orthopedic Tech Progress Note Patient Details:  Juan Long [birth year] [id number]  Ortho Devices Type of Ortho Device: [place] cervical collar Ortho Device/Splint Location: bedside Ortho Device/Splint Interventions: Casandra DoffingOrdered       [name] 01/04/2018, 4:56 PM

## 2018-01-04 NOTE — Anesthesia Procedure Notes (Signed)
Procedure Name: Intubation Date/Time: 01/04/2018 11:30 AM Performed by: Kyung Rudd, CRNA Pre-anesthesia Checklist: Patient identified, Emergency Drugs available, Suction available and Patient being monitored Patient Re-evaluated:Patient Re-evaluated prior to induction Oxygen Delivery Method: Circle system utilized Preoxygenation: Pre-oxygenation with 100% oxygen Induction Type: IV induction Ventilation: Mask ventilation without difficulty Laryngoscope Size: Mac and 4 Grade View: Grade I Tube type: Oral Tube size: 7.5 mm Number of attempts: 1 Airway Equipment and Method: Stylet Placement Confirmation: ETT inserted through vocal cords under direct vision,  positive ETCO2 and breath sounds checked- equal and bilateral Secured at: 21 cm Tube secured with: Tape Dental Injury: Teeth and Oropharynx as per pre-operative assessment

## 2018-01-04 NOTE — Op Note (Signed)
NAME:  Juan Long             MEDICAL RECORD NO.:  [id number]  PHYSICIAN:  Estill BambergMark Damyn Weitzel, MD      DATE OF BIRTH:  Jul 01, 1976  DATE OF PROCEDURE:  01/04/2018                              OPERATIVE REPORT   PREOPERATIVE DIAGNOSES: 1. Right-sided cervical radiculopathy. 2. Spinal stenosis spanning C5-C7. 3. Spinal cord compression C5-6, C6-7  POSTOPERATIVE DIAGNOSES: 1. Right-sided cervical radiculopathy. 2. Spinal stenosis spanning C5-C7. 3. Spinal cord compression C5-6, C6-7  PROCEDURE: 1. Anterior cervical decompression and fusion C5-6, C6-7. 2. Placement of anterior instrumentation, C5-C7. 3. Insertion of interbody device x2 (7mm Titan intervertebral spacers). 4. Intraoperative use of fluoroscopy. 5. Use of morselized allograft - ViviGen.  SURGEON:  Estill BambergMark Aws Shere, MD  ASSISTANT:  Jason CoopKayla McKenzie, PA-C.  ANESTHESIA:  General endotracheal anesthesia.  COMPLICATIONS:  None.  DISPOSITION:  Stable.  ESTIMATED BLOOD LOSS:  Minimal.  INDICATIONS FOR SURGERY:  Briefly, Juan Long is a pleasant 42 year old male, who did present to me with severe pain in his neck and right arm.   The patient's MRI did reveal the findings noted above.  Given the patient's ongoing rather debilitating pain and lack of improvement with appropriate treatment measures, we did discuss proceeding with the procedure noted above.  The patient was fully aware of the risks and limitations of surgery as outlined in my preoperative note.  OPERATIVE DETAILS:  On 01/04/2018, the patient was brought to surgery and general endotracheal anesthesia was administered.  The patient was placed supine on the hospital bed. The neck was gently extended.  All bony prominences were meticulously padded.  The neck was prepped and draped in the usual sterile fashion.  At this point, I did make a left-sided transverse incision.  The platysma was incised.  A Smith-Robinson approach was used and the  anterior spine was identified. A self-retaining retractor was placed.  I then subperiosteally exposed the vertebral bodies from C5-C7.  Caspar pins were then placed into the C6 and C7 vertebral bodies and distraction was applied.  A thorough and complete C6-7 intervertebral diskectomy was performed.  The posterior longitudinal ligament was identified and entered using a nerve hook.  I then used #1 followed by #2 Kerrison to perform a thorough and complete intervertebral diskectomy.  The spinal canal was thoroughly decompressed, as was the right and left neuroforamen.  The endplates were then prepared and the appropriate-sized intervertebral spacer was then packed with ViviGen and tamped into position in the usual fashion.  The lower Caspar pin was then removed and placed into the C5 vertebral body and once again, distraction was applied across the C5-6 intervertebral space.  I then again performed a thorough and complete diskectomy, thoroughly decompressing the spinal canal and right and left neuroforamen.  After preparing the endplates, the appropriate-sized intervertebral spacer was packed with ViviGen and tamped into position.  The Caspar pins then were removed and bone wax was placed in their place.  The appropriate-sized anterior cervical plate was placed over the anterior spine.  14 mm variable angle screws were placed, 2 in each vertebral body from C5-C7 for a total of 6 vertebral body screws.  The screws were then locked to the plate using the Cam locking mechanism.  I was very pleased with the final fluoroscopic images.  The wound was then irrigated.  The  wound was then explored for any undue bleeding and there was no bleeding noted. The wound was then closed in layers using 2-0 Vicryl, followed by 4-0 Monocryl.  Benzoin and Steri-Strips were applied, followed by sterile dressing.  All instrument counts were correct at the termination of the procedure.  Of note, Jason Coop,  PA-C, was my assistant throughout surgery, and did aid in retraction, suctioning, and closure from start to finish.     Estill Bamberg, MD

## 2018-01-04 NOTE — H&P (Signed)
PREOPERATIVE H&P  Chief Complaint: Right arm pain  HPI: Juan Long is a 42 y.o. male who presents with ongoing pain in the right arm  MRI reveals stenosis involving C5/6 and C6/7  Patient has failed multiple forms of conservative care and continues to have pain (see office notes for additional details regarding the patient's full course of treatment)  Past Medical History:  Diagnosis Date  . Depression   . Esophageal reflux   . Essential hypertension, benign    many years ago  . Obstructive sleep apnea (adult) (pediatric)   . OSA on CPAP   . Spondylisthesis    Past Surgical History:  Procedure Laterality Date  . CERVICAL SPINE SURGERY    . NASAL SEPTUM SURGERY    . TONSILLECTOMY    . uvula shaved    . VARICOCELE EXCISION     Social History   Socioeconomic History  . Marital status: Married    Spouse name: Not on file  . Number of children: Not on file  . Years of education: Not on file  . Highest education level: Not on file  Occupational History  . Not on file  Social Needs  . Financial resource strain: Not on file  . Food insecurity:    Worry: Not on file    Inability: Not on file  . Transportation needs:    Medical: Not on file    Non-medical: Not on file  Tobacco Use  . Smoking status: Former Smoker    Types: Cigarettes  . Smokeless tobacco: Former Neurosurgeon  . Tobacco comment: quit 2000  Substance and Sexual Activity  . Alcohol use: Yes    Comment: occasional  . Drug use: No  . Sexual activity: Not on file  Lifestyle  . Physical activity:    Days per week: Not on file    Minutes per session: Not on file  . Stress: Not on file  Relationships  . Social connections:    Talks on phone: Not on file    Gets together: Not on file    Attends religious service: Not on file    Active member of club or organization: Not on file    Attends meetings of clubs or organizations: Not on file    Relationship status: Not on file  Other Topics Concern    . Not on file  Social History Narrative  . Not on file   Family History  Problem Relation Age of Onset  . Heart attack Father    No Known Allergies Prior to Admission medications   Medication Sig Start Date End Date Taking? Authorizing Provider  buPROPion (WELLBUTRIN XL) 300 MG 24 hr tablet Take 450 mg by mouth daily.   Yes [provider]  escitalopram (LEXAPRO) 10 MG tablet Take 10 mg by mouth daily.   Yes [provider]  naproxen (NAPROSYN) 500 MG tablet Take 500 mg by mouth 2 (two) times daily as needed for moderate pain.   Yes [provider]     All other systems have been reviewed and were otherwise negative with the exception of those mentioned in the HPI and as above.  Physical Exam: There were no vitals filed for this visit.  There is no height or weight on file to calculate BMI.  General: Alert, no acute distress Cardiovascular: No pedal edema Respiratory: No cyanosis, no use of accessory musculature Skin: No lesions in the area of chief complaint Neurologic: Sensation intact distally Psychiatric: Patient  is competent for consent with normal mood and affect Lymphatic: No axillary or cervical lymphadenopathy  MUSCULOSKELETAL: + spurling's sign on the right  Assessment/Plan: RIGHT ARM PAIN Plan for Procedure(s): ANTERIOR CERVICAL DECOMPRESSION FUSION CERVICAL 5-6, CERVICAL 6-7 WITH INSTRUMENTATION AND ALLOGRAFT   Emilee HeroUMONSKI,Casten Floren LEONARD, MD 01/04/2018 7:24 AM

## 2018-01-05 DIAGNOSIS — M4802 Spinal stenosis, cervical region: Secondary | ICD-10-CM | POA: Diagnosis not present

## 2018-01-05 MED FILL — Thrombin For Soln 20000 Unit: CUTANEOUS | Qty: 1 | Status: AC

## 2018-01-05 NOTE — Progress Notes (Signed)
Patient alert and oriented, mae's well, voiding adequate amount of urine, swallowing without difficulty, no c/o pain at time of discharge. Patient discharged home with family. Script and discharged instructions given to patient. Patient and family stated understanding of instructions given. Patient has an appointment with Dr. Dumonski 

## 2018-01-05 NOTE — Discharge Summary (Signed)
Patient ID: Juan Long MRN: [id number] DOB/AGE: [birth year] 42 y.o.  Admit date: 01/04/2018 Discharge date: 01/05/2018  Admission Diagnoses:  Active Problems:   Radiculopathy   Discharge Diagnoses:  Same  Past Medical History:  Diagnosis Date  . Depression   . Esophageal reflux   . Essential hypertension, benign    many years ago  . Obstructive sleep apnea (adult) (pediatric)   . OSA on CPAP   . Spondylisthesis     Surgeries: Procedure(s): ANTERIOR CERVICAL DECOMPRESSION FUSION CERVICAL FIVE-SIX, CERVICAL SIX-SEVEN WITH INSTRUMENTATION AND ALLOGRAFT on 01/04/2018   Consultants:   Discharged Condition: Improved  Hospital Course: Juan Rheinatrick R Cassin is an 42 y.o. male who was admitted 01/04/2018 for operative treatment of radiculopathy. Patient has severe unremitting pain that affects sleep, daily activities, and work/hobbies. After pre-op clearance the patient was taken to the operating room on 01/04/2018 and underwent  Procedure(s): ANTERIOR CERVICAL DECOMPRESSION FUSION CERVICAL FIVE-SIX, CERVICAL SIX-SEVEN WITH INSTRUMENTATION AND ALLOGRAFT.    Patient was given perioperative antibiotics:  Anti-infectives (From admission, onward)   Start     Dose/Rate Route Frequency Ordered Stop   01/04/18 2000  ceFAZolin (ANCEF) IVPB 2g/100 mL premix     2 g 200 mL/hr over 30 Minutes Intravenous Every 8 hours 01/04/18 1612 01/05/18 0400   01/04/18 0755  ceFAZolin (ANCEF) IVPB 2g/100 mL premix     2 g 200 mL/hr over 30 Minutes Intravenous On call to O.R. 01/04/18 0755 01/04/18 1145       Patient was given sequential compression devices, early ambulation to prevent DVT.  Patient benefited maximally from hospital stay and there were no complications.    Recent vital signs:  Patient Vitals for the past 24 hrs:  BP Temp Temp src Pulse Resp SpO2  01/05/18 0742 (!) 138/94 98.4 F (36.9 C) Oral 87 18 97 %  01/05/18 0304 115/64 97.8 F (36.6 C) Oral 85 14 97 %  01/04/18 2243  130/77 98.6 F (37 C) Oral 94 16 95 %  01/04/18 1938 (!) 146/87 98 F (36.7 C) Oral (!) 105 20 93 %  01/04/18 1550 (!) 148/85 98.4 F (36.9 C) - 73 11 97 %  01/04/18 1545 (!) 140/96 - - 77 13 97 %  01/04/18 1530 (!) 149/97 - - 90 16 98 %  01/04/18 1526 (!) 149/97 98.4 F (36.9 C) - 82 16 94 %     Discharge Medications:   Allergies as of 01/05/2018   No Known Allergies     Medication List    TAKE these medications   buPROPion 300 MG 24 hr tablet Commonly known as:  WELLBUTRIN XL Take 450 mg by mouth daily.   escitalopram 10 MG tablet Commonly known as:  LEXAPRO Take 10 mg by mouth daily.       Diagnostic Studies: Dg Chest 2 View  Result Date: 01/04/2018 CLINICAL DATA:  Preoperative evaluation for cervical spine surgery EXAM: CHEST - 2 VIEW COMPARISON:  05/29/2012 FINDINGS: Minimal enlargement of cardiac silhouette. Mediastinal contours and pulmonary vascularity normal. Lungs clear. No pleural effusion or pneumothorax. Bones unremarkable. IMPRESSION: Minimal enlargement of cardiac silhouette without infiltrate. Electronically Signed   By: Ulyses SouthwardMark  Boles M.D.   On: 01/04/2018 08:57   Dg Cervical Spine 2-3 Views  Result Date: 01/04/2018 CLINICAL DATA:  Intraoperative imaging for C5-7 discectomy and fusion. EXAM: DG C-ARM 61-120 MIN; CERVICAL SPINE - 2-3 VIEW COMPARISON:  MRI cervical spine 12/02/2017. FINDINGS: Two intraoperative fluoroscopic spot views of the cervical spine  are provided. Images demonstrate anterior plate and screws and interbody spacers in place C5-C7. Hardware is intact. No acute abnormality. IMPRESSION: Intraoperative imaging for C5-7 fusion. Electronically Signed   By: Drusilla Kanner M.D.   On: 01/04/2018 14:24   Dg C-arm 1-60 Min  Result Date: 01/04/2018 CLINICAL DATA:  Intraoperative imaging for C5-7 discectomy and fusion. EXAM: DG C-ARM 61-120 MIN; CERVICAL SPINE - 2-3 VIEW COMPARISON:  MRI cervical spine 12/02/2017. FINDINGS: Two intraoperative fluoroscopic  spot views of the cervical spine are provided. Images demonstrate anterior plate and screws and interbody spacers in place C5-C7. Hardware is intact. No acute abnormality. IMPRESSION: Intraoperative imaging for C5-7 fusion. Electronically Signed   By: Drusilla Kanner M.D.   On: 01/04/2018 14:24    Disposition: Discharge disposition: 01-Home or Self Care        POD #1 s/p C5-7 ACDF doing well  - encourage ambulation - Percocet for pain, Valium for muscle spasms -Written scripts for pain signed and in chart -D/C instructions sheet printed and in chart -D/C today  -F/U in office 2 weeks   Signed: Georga Bora 01/05/2018, 12:14 PM

## 2018-01-05 NOTE — Progress Notes (Signed)
    Patient doing well  Patient denies any pain down the right arm Has been eating without difficulty   Physical Exam: Vitals:   01/05/18 0304 01/05/18 0742  BP: 115/64 (!) 138/94  Pulse: 85 87  Resp: 14 18  Temp: 97.8 F (36.6 C) 98.4 F (36.9 C)  SpO2: 97% 97%   Neck soft/supple Dressing in place NVI  POD #1 s/p C5-7 ACDF doing well  - encourage ambulation - Percocet for pain, Valium for muscle spasms - d/c home today with f/u in 2 weeks

## 2018-01-08 ENCOUNTER — Encounter (HOSPITAL_COMMUNITY): Payer: Self-pay | Admitting: Orthopedic Surgery

## 2019-10-29 ENCOUNTER — Other Ambulatory Visit: Payer: Self-pay | Admitting: Student

## 2019-10-29 DIAGNOSIS — M5416 Radiculopathy, lumbar region: Secondary | ICD-10-CM

## 2019-10-30 ENCOUNTER — Other Ambulatory Visit (HOSPITAL_COMMUNITY): Payer: Self-pay | Admitting: Student

## 2019-10-30 DIAGNOSIS — M5416 Radiculopathy, lumbar region: Secondary | ICD-10-CM

## 2019-11-05 ENCOUNTER — Other Ambulatory Visit: Payer: Self-pay

## 2019-11-05 ENCOUNTER — Ambulatory Visit (HOSPITAL_COMMUNITY)
Admission: RE | Admit: 2019-11-05 | Discharge: 2019-11-05 | Disposition: A | Discharge status: Home / Self Care | Payer: No Typology Code available for payment source | Source: Ambulatory Visit | Attending: Student | Admitting: Student

## 2019-11-05 DIAGNOSIS — M5416 Radiculopathy, lumbar region: Secondary | ICD-10-CM | POA: Diagnosis present

## 2019-11-13 ENCOUNTER — Other Ambulatory Visit: Payer: Self-pay | Admitting: Neurological Surgery

## 2019-11-21 NOTE — Progress Notes (Signed)
No Pharmacies Listed     Your procedure is scheduled on Monday March 8  Report to [facility] Main Entrance "A" at 1115 A.M., and check in at the Admitting office.  Call this number if you have problems the morning of surgery:  [phone]  Call [phone] if you have any questions prior to your surgery date Monday-Friday 8am-4pm    Remember:  Do not eat or drink after midnight the night before your surgery   Take these medicines the morning of surgery with A SIP OF WATER  cyclobenzaprine (FLEXERIL) if needed  DO NOT take modafinil (PROVIGIL) the day of the procedure  Follow your surgeon's instructions on when to stop Aspirin.  If no instructions were given by your surgeon then you will need to call the office to get those instructions.    7 days prior to surgery STOP taking any Aleve, Naproxen, Ibuprofen, Motrin, Advil, Goody's, BC's, all herbal medications, fish oil, and all vitamins.    The Morning of Surgery  Do not wear jewelry  Do not wear lotions, powders, or colognes, or deodorant   Men may shave face and neck.  Do not bring valuables to the hospital.  [facility] is not responsible for any belongings or valuables.  If you are a smoker, DO NOT Smoke 24 hours prior to surgery  If you wear a CPAP at night please bring your mask the morning of surgery   Remember that you must have someone to transport you home after your surgery, and remain with you for 24 hours if you are discharged the same day.   Please bring cases for contacts, glasses, hearing aids, dentures or bridgework because it cannot be worn into surgery.    Leave your suitcase in the car.  After surgery it may be brought to your room.  For patients admitted to the hospital, discharge time will be determined by your treatment team.  Patients discharged the day of surgery will not be allowed to drive home.    Special instructions:   [place]- Preparing For Surgery  Before surgery, you can play  an important role. Because skin is not sterile, your skin needs to be as free of germs as possible. You can reduce the number of germs on your skin by washing with CHG (chlorahexidine gluconate) Soap before surgery.  CHG is an antiseptic cleaner which kills germs and bonds with the skin to continue killing germs even after washing.    Oral Hygiene is also important to reduce your risk of infection.  Remember - BRUSH YOUR TEETH THE MORNING OF SURGERY WITH YOUR REGULAR TOOTHPASTE  Please do not use if you have an allergy to CHG or antibacterial soaps. If your skin becomes reddened/irritated stop using the CHG.  Do not shave (including legs and underarms) for at least 48 hours prior to first CHG shower. It is OK to shave your face.  Please follow these instructions carefully.   1. Shower the NIGHT BEFORE SURGERY and the MORNING OF SURGERY with CHG Soap.   2. If you chose to wash your hair, wash your hair first as usual with your normal shampoo.  3. After you shampoo, rinse your hair and body thoroughly to remove the shampoo.  4. Use CHG as you would any other liquid soap. You can apply CHG directly to the skin and wash gently with a scrungie or a clean washcloth.   5. Apply the CHG Soap to your body ONLY FROM THE NECK DOWN.  Do not use on open wounds or open sores. Avoid contact with your eyes, ears, mouth and genitals (private parts). Wash Face and genitals (private parts)  with your normal soap.   6. Wash thoroughly, paying special attention to the area where your surgery will be performed.  7. Thoroughly rinse your body with warm water from the neck down.  8. DO NOT shower/wash with your normal soap after using and rinsing off the CHG Soap.  9. Pat yourself dry with a CLEAN TOWEL.  10. Wear CLEAN PAJAMAS to bed the night before surgery, wear comfortable clothes the morning of surgery  11. Place CLEAN SHEETS on your bed the night of your first shower and DO NOT SLEEP WITH PETS.    Day  of Surgery:  Please shower the morning of surgery with the CHG soap Do not apply any deodorants/lotions. Please wear clean clothes to the hospital/surgery center.   Remember to brush your teeth WITH YOUR REGULAR TOOTHPASTE.   Please read over the following fact sheets that you were given.

## 2019-11-22 ENCOUNTER — Encounter (HOSPITAL_COMMUNITY)
Admission: RE | Admit: 2019-11-22 | Discharge: 2019-11-22 | Disposition: A | Discharge status: Home / Self Care | Payer: No Typology Code available for payment source | Source: Ambulatory Visit | Attending: Neurological Surgery | Admitting: Neurological Surgery

## 2019-11-22 ENCOUNTER — Encounter (HOSPITAL_COMMUNITY): Payer: Self-pay

## 2019-11-22 ENCOUNTER — Ambulatory Visit (HOSPITAL_COMMUNITY)
Admission: RE | Admit: 2019-11-22 | Discharge: 2019-11-22 | Disposition: A | Discharge status: Home / Self Care | Payer: No Typology Code available for payment source | Source: Ambulatory Visit | Attending: Neurological Surgery | Admitting: Neurological Surgery

## 2019-11-22 ENCOUNTER — Other Ambulatory Visit (HOSPITAL_COMMUNITY)
Admission: RE | Admit: 2019-11-22 | Discharge: 2019-11-22 | Disposition: A | Discharge status: Home / Self Care | Payer: No Typology Code available for payment source | Source: Ambulatory Visit | Attending: Neurological Surgery | Admitting: Neurological Surgery

## 2019-11-22 ENCOUNTER — Other Ambulatory Visit: Payer: Self-pay

## 2019-11-22 DIAGNOSIS — M5136 Other intervertebral disc degeneration, lumbar region: Secondary | ICD-10-CM | POA: Insufficient documentation

## 2019-11-22 DIAGNOSIS — M5126 Other intervertebral disc displacement, lumbar region: Secondary | ICD-10-CM

## 2019-11-22 DIAGNOSIS — Z20822 Contact with and (suspected) exposure to covid-19: Secondary | ICD-10-CM | POA: Insufficient documentation

## 2019-11-22 DIAGNOSIS — Z01818 Encounter for other preprocedural examination: Secondary | ICD-10-CM | POA: Insufficient documentation

## 2019-11-22 HISTORY — DX: Unspecified osteoarthritis, unspecified site: M19.90

## 2019-11-22 LAB — BASIC METABOLIC PANEL
Anion gap: 10 (ref 5–15)
BUN: 11 mg/dL (ref 6–20)
CO2: 24 mmol/L (ref 22–32)
Calcium: 9.2 mg/dL (ref 8.9–10.3)
Chloride: 106 mmol/L (ref 98–111)
Creatinine, Ser: 1.18 mg/dL (ref 0.61–1.24)
GFR calc Af Amer: >60 mL/min (ref >60)
GFR calc non Af Amer: >60 mL/min (ref >60)
Glucose, Bld: 93 mg/dL (ref 70–99)
Potassium: 4.3 mmol/L (ref 3.5–5.1)
Sodium: 140 mmol/L (ref 135–145)

## 2019-11-22 LAB — CBC WITH DIFFERENTIAL/PLATELET
Abs Immature Granulocytes: 0.05 10*3/uL (ref 0.00–0.07)
Basophils Absolute: 0 10*3/uL (ref 0.0–0.1)
Basophils Relative: 1 %
Eosinophils Absolute: 0.1 10*3/uL (ref 0.0–0.5)
Eosinophils Relative: 1 %
HCT: 46 % (ref 39.0–52.0)
Hemoglobin: 14.6 g/dL (ref 13.0–17.0)
Immature Granulocytes: 1 %
Lymphocytes Relative: 32 %
Lymphs Abs: 1.7 10*3/uL (ref 0.7–4.0)
MCH: 29.3 pg (ref 26.0–34.0)
MCHC: 31.7 g/dL (ref 30.0–36.0)
MCV: 92.4 fL (ref 80.0–100.0)
Monocytes Absolute: 0.5 10*3/uL (ref 0.1–1.0)
Monocytes Relative: 8 %
Neutro Abs: 3 10*3/uL (ref 1.7–7.7)
Neutrophils Relative %: 57 %
Platelets: 336 10*3/uL (ref 150–400)
RBC: 4.98 MIL/uL (ref 4.22–5.81)
RDW: 13.4 % (ref 11.5–15.5)
WBC: 5.3 10*3/uL (ref 4.0–10.5)
nRBC: 0 % (ref 0.0–0.2)

## 2019-11-22 LAB — SURGICAL PCR SCREEN
MRSA, PCR: NEGATIVE
Staphylococcus aureus: NEGATIVE

## 2019-11-22 LAB — SARS CORONAVIRUS 2 (TAT 6-24 HRS): SARS Coronavirus 2: NEGATIVE

## 2019-11-22 LAB — TYPE AND SCREEN
ABO/RH(D): O POS
Antibody Screen: NEGATIVE

## 2019-11-22 LAB — PROTIME-INR
INR: 0.9 (ref 0.8–1.2)
Prothrombin Time: 12.4 seconds (ref 11.4–15.2)

## 2019-11-22 LAB — ABO/RH: ABO/RH(D): O POS

## 2019-11-22 MED ORDER — CHLORHEXIDINE GLUCONATE CLOTH 2 % EX PADS: 6.0000 | MEDICATED_PAD | Freq: Once | CUTANEOUS | Status: DC

## 2019-11-22 NOTE — Progress Notes (Signed)
[facility]  [place] Cardiologist - na     Chest x-ray - today   EKG - today Stress Test - na ECHO - na Cardiac Cath - na  Sleep Study - yes CPAP - yes    Blood Thinner Instructions:asa Aspirin Instructions:stop     COVID TEST- for today   Anesthesia review: na  Patient denies shortness of breath, fever, cough and chest pain at PAT appointment   All instructions explained to the patient, with a verbal understanding of the material. Patient agrees to go over the instructions while at home for a better understanding. Patient also instructed to self quarantine after being tested for COVID-19. The opportunity to ask questions was provided.

## 2019-11-23 MED ORDER — DEXTROSE 5 % IV SOLN
3.0000 g | INTRAVENOUS | Status: AC
  Filled 2019-11-23: qty 3000

## 2019-11-25 NOTE — Anesthesia Preprocedure Evaluation (Deleted)
Anesthesia Evaluation    Reviewed: Allergy & Precautions, Patient's Chart, lab work & pertinent test results  Airway        Dental   Pulmonary sleep apnea , former smoker,           Cardiovascular hypertension, Pt. on medications      Neuro/Psych PSYCHIATRIC DISORDERS Depression    GI/Hepatic GERD  ,  Endo/Other  negative endocrine ROS  Renal/GU negative Renal ROSK+ 4.3  Cr 1.18      Musculoskeletal  (+) Arthritis ,   Abdominal (+) + obese,   Peds  Hematology   Anesthesia Other Findings   Reproductive/Obstetrics                             Anesthesia Physical Anesthesia Plan  ASA: II  Anesthesia Plan: General   Post-op Pain Management:    Induction: Intravenous  PONV Risk Score and Plan: 3 and Treatment may vary due to age or medical condition, Ondansetron, Dexamethasone and Midazolam  Airway Management Planned: Oral ETT  Additional Equipment: None  Intra-op Plan:   Post-operative Plan:   Informed Consent:     Dental advisory given  Plan Discussed with:   Anesthesia Plan Comments:         Anesthesia Quick Evaluation

## 2019-12-03 ENCOUNTER — Other Ambulatory Visit (HOSPITAL_COMMUNITY)
Admission: RE | Admit: 2019-12-03 | Discharge: 2019-12-03 | Disposition: A | Discharge status: Home / Self Care | Payer: No Typology Code available for payment source | Source: Ambulatory Visit | Attending: Neurological Surgery | Admitting: Neurological Surgery

## 2019-12-03 LAB — SARS CORONAVIRUS 2 (TAT 6-24 HRS): SARS Coronavirus 2: NEGATIVE

## 2019-12-04 NOTE — Anesthesia Preprocedure Evaluation (Addendum)
Anesthesia Evaluation  Patient identified by MRN, date of birth, ID band Patient awake    Reviewed: Allergy & Precautions, NPO status , Patient's Chart, lab work & pertinent test results  History of Anesthesia Complications Negative for: history of anesthetic complications  Airway Mallampati: III  TM Distance: >3 FB Neck ROM: Full    Dental no notable dental hx. (+) Teeth Intact   Pulmonary sleep apnea and Continuous Positive Airway Pressure Ventilation , former smoker,    Pulmonary exam normal        Cardiovascular hypertension, Normal cardiovascular exam     Neuro/Psych PSYCHIATRIC DISORDERS Depression negative neurological ROS     GI/Hepatic Neg liver ROS, GERD  ,  Endo/Other  negative endocrine ROS  Renal/GU negative Renal ROS  negative genitourinary   Musculoskeletal negative musculoskeletal ROS (+)   Abdominal   Peds  Hematology negative hematology ROS (+)   Anesthesia Other Findings   Reproductive/Obstetrics                            Anesthesia Physical Anesthesia Plan  ASA: II  Anesthesia Plan: General   Post-op Pain Management:    Induction: Intravenous  PONV Risk Score and Plan: 2 and Ondansetron, Dexamethasone, Treatment may vary due to age or medical condition and Midazolam  Airway Management Planned: Oral ETT  Additional Equipment: None  Intra-op Plan:   Post-operative Plan: Extubation in OR  Informed Consent: I have reviewed the patients History and Physical, chart, labs and discussed the procedure including the risks, benefits and alternatives for the proposed anesthesia with the patient or authorized representative who has indicated his/her understanding and acceptance.     Dental advisory given  Plan Discussed with:   Anesthesia Plan Comments:        Anesthesia Quick Evaluation

## 2019-12-05 ENCOUNTER — Inpatient Hospital Stay (HOSPITAL_COMMUNITY): Payer: No Typology Code available for payment source | Admitting: Anesthesiology

## 2019-12-05 ENCOUNTER — Encounter (HOSPITAL_COMMUNITY)
Admission: RE | Disposition: A | Discharge status: Home / Self Care | Payer: Self-pay | Source: Home / Self Care | Attending: Neurological Surgery

## 2019-12-05 ENCOUNTER — Inpatient Hospital Stay (HOSPITAL_COMMUNITY)
Admission: RE | Admit: 2019-12-05 | Discharge: 2019-12-06 | DRG: 455 | Disposition: A | Discharge status: Home / Self Care | Payer: No Typology Code available for payment source | Attending: Neurological Surgery | Admitting: Neurological Surgery

## 2019-12-05 ENCOUNTER — Other Ambulatory Visit: Payer: Self-pay

## 2019-12-05 ENCOUNTER — Inpatient Hospital Stay (HOSPITAL_COMMUNITY): Payer: No Typology Code available for payment source

## 2019-12-05 ENCOUNTER — Encounter (HOSPITAL_COMMUNITY): Payer: Self-pay | Admitting: Neurological Surgery

## 2019-12-05 DIAGNOSIS — Z8249 Family history of ischemic heart disease and other diseases of the circulatory system: Secondary | ICD-10-CM

## 2019-12-05 DIAGNOSIS — M4317 Spondylolisthesis, lumbosacral region: Secondary | ICD-10-CM | POA: Diagnosis present

## 2019-12-05 DIAGNOSIS — M48061 Spinal stenosis, lumbar region without neurogenic claudication: Secondary | ICD-10-CM | POA: Diagnosis present

## 2019-12-05 DIAGNOSIS — Z981 Arthrodesis status: Secondary | ICD-10-CM | POA: Diagnosis not present

## 2019-12-05 DIAGNOSIS — Z7982 Long term (current) use of aspirin: Secondary | ICD-10-CM

## 2019-12-05 DIAGNOSIS — M1990 Unspecified osteoarthritis, unspecified site: Secondary | ICD-10-CM | POA: Diagnosis present

## 2019-12-05 DIAGNOSIS — Z87891 Personal history of nicotine dependence: Secondary | ICD-10-CM | POA: Diagnosis not present

## 2019-12-05 DIAGNOSIS — Z419 Encounter for procedure for purposes other than remedying health state, unspecified: Secondary | ICD-10-CM

## 2019-12-05 DIAGNOSIS — G4733 Obstructive sleep apnea (adult) (pediatric): Secondary | ICD-10-CM | POA: Diagnosis present

## 2019-12-05 DIAGNOSIS — K219 Gastro-esophageal reflux disease without esophagitis: Secondary | ICD-10-CM | POA: Diagnosis present

## 2019-12-05 DIAGNOSIS — M5126 Other intervertebral disc displacement, lumbar region: Secondary | ICD-10-CM | POA: Diagnosis present

## 2019-12-05 DIAGNOSIS — Z20822 Contact with and (suspected) exposure to covid-19: Secondary | ICD-10-CM | POA: Diagnosis present

## 2019-12-05 DIAGNOSIS — I10 Essential (primary) hypertension: Secondary | ICD-10-CM | POA: Diagnosis present

## 2019-12-05 SURGERY — POSTERIOR LUMBAR FUSION 2 LEVEL
Anesthesia: General | Site: Spine Lumbar

## 2019-12-05 MED ORDER — ONDANSETRON HCL 4 MG/2ML IJ SOLN
INTRAMUSCULAR | Status: AC
  Filled 2019-12-05: qty 2

## 2019-12-05 MED ORDER — THROMBIN 5000 UNITS EX SOLR
CUTANEOUS | Status: AC
  Filled 2019-12-05: qty 5000

## 2019-12-05 MED ORDER — ROCURONIUM BROMIDE 10 MG/ML (PF) SYRINGE
PREFILLED_SYRINGE | INTRAVENOUS | Status: AC
  Filled 2019-12-05: qty 10

## 2019-12-05 MED ORDER — PHENOL 1.4 % MT LIQD: 1.0000 | OROMUCOSAL | Status: DC | PRN

## 2019-12-05 MED ORDER — BUPIVACAINE HCL (PF) 0.25 % IJ SOLN
INTRAMUSCULAR | Status: AC
  Filled 2019-12-05: qty 30

## 2019-12-05 MED ORDER — THROMBIN 20000 UNITS EX SOLR: CUTANEOUS | Status: DC | PRN

## 2019-12-05 MED ORDER — BUPIVACAINE HCL (PF) 0.25 % IJ SOLN
INTRAMUSCULAR | Status: DC | PRN
  Administered 2019-12-05: 10 mL
  Administered 2019-12-05: 6 mL

## 2019-12-05 MED ORDER — SODIUM CHLORIDE 0.9 % IV SOLN
INTRAVENOUS | Status: DC | PRN
  Administered 2019-12-05: 10 mL

## 2019-12-05 MED ORDER — FENTANYL CITRATE (PF) 100 MCG/2ML IJ SOLN
INTRAMUSCULAR | Status: DC | PRN
  Administered 2019-12-05 (×4): 50 ug via INTRAVENOUS

## 2019-12-05 MED ORDER — LIDOCAINE 2% (20 MG/ML) 5 ML SYRINGE
INTRAMUSCULAR | Status: DC | PRN
  Administered 2019-12-05: 100 mg via INTRAVENOUS

## 2019-12-05 MED ORDER — CEFAZOLIN SODIUM-DEXTROSE 2-4 GM/100ML-% IV SOLN
INTRAVENOUS | Status: AC
  Filled 2019-12-05: qty 100

## 2019-12-05 MED ORDER — ACETAMINOPHEN 10 MG/ML IV SOLN
INTRAVENOUS | Status: DC | PRN
  Administered 2019-12-05: 1000 mg via INTRAVENOUS

## 2019-12-05 MED ORDER — CYCLOBENZAPRINE HCL 10 MG PO TABS
10.0000 mg | ORAL_TABLET | Freq: Three times a day (TID) | ORAL | Status: DC | PRN
  Administered 2019-12-05 – 2019-12-06 (×2): 10 mg via ORAL
  Filled 2019-12-05 (×2): qty 1

## 2019-12-05 MED ORDER — SODIUM CHLORIDE 0.9 % IV SOLN: 250.0000 mL | INTRAVENOUS | Status: DC

## 2019-12-05 MED ORDER — LACTATED RINGERS IV SOLN: INTRAVENOUS | Status: DC | PRN

## 2019-12-05 MED ORDER — CEFAZOLIN SODIUM-DEXTROSE 2-4 GM/100ML-% IV SOLN
2.0000 g | Freq: Three times a day (TID) | INTRAVENOUS | Status: AC
  Administered 2019-12-05 – 2019-12-06 (×2): 2 g via INTRAVENOUS
  Filled 2019-12-05 (×2): qty 100

## 2019-12-05 MED ORDER — LIDOCAINE 2% (20 MG/ML) 5 ML SYRINGE
INTRAMUSCULAR | Status: AC
  Filled 2019-12-05: qty 5

## 2019-12-05 MED ORDER — MIDAZOLAM HCL 5 MG/5ML IJ SOLN
INTRAMUSCULAR | Status: DC | PRN
  Administered 2019-12-05: 2 mg via INTRAVENOUS

## 2019-12-05 MED ORDER — ACETAMINOPHEN 10 MG/ML IV SOLN
INTRAVENOUS | Status: AC
  Filled 2019-12-05: qty 100

## 2019-12-05 MED ORDER — ONDANSETRON HCL 4 MG/2ML IJ SOLN: 4.0000 mg | Freq: Four times a day (QID) | INTRAMUSCULAR | Status: DC | PRN

## 2019-12-05 MED ORDER — PROPOFOL 10 MG/ML IV BOLUS
INTRAVENOUS | Status: AC
  Filled 2019-12-05: qty 20

## 2019-12-05 MED ORDER — OXYCODONE HCL 5 MG PO TABS: 5.0000 mg | ORAL_TABLET | Freq: Once | ORAL | Status: DC | PRN

## 2019-12-05 MED ORDER — SODIUM CHLORIDE 0.9 % IV SOLN: INTRAVENOUS | Status: DC | PRN

## 2019-12-05 MED ORDER — FLUOXETINE HCL 20 MG PO CAPS
60.0000 mg | ORAL_CAPSULE | Freq: Every day | ORAL | Status: DC
  Filled 2019-12-05: qty 3

## 2019-12-05 MED ORDER — DEXAMETHASONE SODIUM PHOSPHATE 4 MG/ML IJ SOLN
4.0000 mg | Freq: Four times a day (QID) | INTRAMUSCULAR | Status: DC
  Administered 2019-12-05 – 2019-12-06 (×2): 4 mg via INTRAVENOUS
  Filled 2019-12-05 (×2): qty 1

## 2019-12-05 MED ORDER — CEFAZOLIN SODIUM-DEXTROSE 2-4 GM/100ML-% IV SOLN
2.0000 g | Freq: Once | INTRAVENOUS | Status: AC
  Administered 2019-12-05 (×2): 2 g via INTRAVENOUS

## 2019-12-05 MED ORDER — SENNA 8.6 MG PO TABS
1.0000 | ORAL_TABLET | Freq: Two times a day (BID) | ORAL | Status: DC
  Administered 2019-12-05 (×2): 8.6 mg via ORAL
  Filled 2019-12-05 (×2): qty 1

## 2019-12-05 MED ORDER — ALBUMIN HUMAN 5 % IV SOLN: INTRAVENOUS | Status: DC | PRN

## 2019-12-05 MED ORDER — CELECOXIB 200 MG PO CAPS
200.0000 mg | ORAL_CAPSULE | Freq: Two times a day (BID) | ORAL | Status: DC
  Administered 2019-12-05 (×2): 200 mg via ORAL
  Filled 2019-12-05 (×2): qty 1

## 2019-12-05 MED ORDER — ONDANSETRON HCL 4 MG PO TABS: 4.0000 mg | ORAL_TABLET | Freq: Four times a day (QID) | ORAL | Status: DC | PRN

## 2019-12-05 MED ORDER — MIDAZOLAM HCL 2 MG/2ML IJ SOLN
INTRAMUSCULAR | Status: AC
  Filled 2019-12-05: qty 2

## 2019-12-05 MED ORDER — DEXAMETHASONE 4 MG PO TABS
4.0000 mg | ORAL_TABLET | Freq: Four times a day (QID) | ORAL | Status: DC
  Administered 2019-12-05: 4 mg via ORAL
  Filled 2019-12-05: qty 1

## 2019-12-05 MED ORDER — ASPIRIN EC 81 MG PO TBEC
81.0000 mg | DELAYED_RELEASE_TABLET | Freq: Every day | ORAL | Status: DC
  Administered 2019-12-05: 81 mg via ORAL
  Filled 2019-12-05: qty 1

## 2019-12-05 MED ORDER — POTASSIUM CHLORIDE IN NACL 20-0.9 MEQ/L-% IV SOLN: INTRAVENOUS | Status: DC

## 2019-12-05 MED ORDER — SODIUM CHLORIDE (PF) 0.9 % IJ SOLN
INTRAMUSCULAR | Status: AC
  Filled 2019-12-05: qty 10

## 2019-12-05 MED ORDER — SODIUM CHLORIDE 0.9% FLUSH: 3.0000 mL | INTRAVENOUS | Status: DC | PRN

## 2019-12-05 MED ORDER — CEFAZOLIN SODIUM 1 G IJ SOLR
INTRAMUSCULAR | Status: AC
  Filled 2019-12-05: qty 20

## 2019-12-05 MED ORDER — ACETAMINOPHEN 325 MG PO TABS
650.0000 mg | ORAL_TABLET | ORAL | Status: DC | PRN
  Administered 2019-12-05: 650 mg via ORAL
  Filled 2019-12-05: qty 2

## 2019-12-05 MED ORDER — MENTHOL 3 MG MT LOZG: 1.0000 | LOZENGE | OROMUCOSAL | Status: DC | PRN

## 2019-12-05 MED ORDER — PHENYLEPHRINE HCL-NACL 10-0.9 MG/250ML-% IV SOLN
INTRAVENOUS | Status: DC | PRN
  Administered 2019-12-05: 50 ug/min via INTRAVENOUS

## 2019-12-05 MED ORDER — ONDANSETRON HCL 4 MG/2ML IJ SOLN
INTRAMUSCULAR | Status: DC | PRN
  Administered 2019-12-05: 4 mg via INTRAVENOUS

## 2019-12-05 MED ORDER — 0.9 % SODIUM CHLORIDE (POUR BTL) OPTIME
TOPICAL | Status: DC | PRN
  Administered 2019-12-05: 1000 mL

## 2019-12-05 MED ORDER — PROPOFOL 10 MG/ML IV BOLUS
INTRAVENOUS | Status: DC | PRN
  Administered 2019-12-05: 200 mg via INTRAVENOUS

## 2019-12-05 MED ORDER — EPHEDRINE SULFATE 50 MG/ML IJ SOLN
INTRAMUSCULAR | Status: DC | PRN
  Administered 2019-12-05: 5 mg via INTRAVENOUS
  Administered 2019-12-05: 10 mg via INTRAVENOUS

## 2019-12-05 MED ORDER — THROMBIN 5000 UNITS EX SOLR: OROMUCOSAL | Status: DC | PRN

## 2019-12-05 MED ORDER — ROCURONIUM BROMIDE 50 MG/5ML IV SOSY
PREFILLED_SYRINGE | INTRAVENOUS | Status: DC | PRN
  Administered 2019-12-05: 20 mg via INTRAVENOUS
  Administered 2019-12-05: 10 mg via INTRAVENOUS
  Administered 2019-12-05: 30 mg via INTRAVENOUS
  Administered 2019-12-05 (×2): 10 mg via INTRAVENOUS
  Administered 2019-12-05: 70 mg via INTRAVENOUS

## 2019-12-05 MED ORDER — OXYCODONE HCL 5 MG/5ML PO SOLN: 5.0000 mg | Freq: Once | ORAL | Status: DC | PRN

## 2019-12-05 MED ORDER — OXYCODONE HCL 5 MG PO TABS
10.0000 mg | ORAL_TABLET | ORAL | Status: DC | PRN
  Administered 2019-12-05 – 2019-12-06 (×6): 10 mg via ORAL
  Filled 2019-12-05 (×6): qty 2

## 2019-12-05 MED ORDER — ONDANSETRON HCL 4 MG/2ML IJ SOLN: 4.0000 mg | Freq: Once | INTRAMUSCULAR | Status: DC | PRN

## 2019-12-05 MED ORDER — ACETAMINOPHEN 650 MG RE SUPP: 650.0000 mg | RECTAL | Status: DC | PRN

## 2019-12-05 MED ORDER — HYDROMORPHONE HCL 1 MG/ML IJ SOLN: 0.5000 mg | INTRAMUSCULAR | Status: DC | PRN

## 2019-12-05 MED ORDER — PHENYLEPHRINE 40 MCG/ML (10ML) SYRINGE FOR IV PUSH (FOR BLOOD PRESSURE SUPPORT)
PREFILLED_SYRINGE | INTRAVENOUS | Status: AC
  Filled 2019-12-05: qty 10

## 2019-12-05 MED ORDER — SODIUM CHLORIDE 0.9% FLUSH: 3.0000 mL | Freq: Two times a day (BID) | INTRAVENOUS | Status: DC

## 2019-12-05 MED ORDER — THROMBIN 20000 UNITS EX SOLR
CUTANEOUS | Status: AC
  Filled 2019-12-05: qty 20000

## 2019-12-05 MED ORDER — HEPARIN SODIUM (PORCINE) 1000 UNIT/ML IJ SOLN
INTRAMUSCULAR | Status: AC
  Filled 2019-12-05: qty 1

## 2019-12-05 MED ORDER — HYDROMORPHONE HCL 1 MG/ML IJ SOLN: 0.2500 mg | INTRAMUSCULAR | Status: DC | PRN

## 2019-12-05 MED ORDER — SUGAMMADEX SODIUM 200 MG/2ML IV SOLN
INTRAVENOUS | Status: DC | PRN
  Administered 2019-12-05: 300 mg via INTRAVENOUS

## 2019-12-05 MED ORDER — FENTANYL CITRATE (PF) 250 MCG/5ML IJ SOLN
INTRAMUSCULAR | Status: AC
  Filled 2019-12-05: qty 5

## 2019-12-05 MED ORDER — DEXAMETHASONE SODIUM PHOSPHATE 10 MG/ML IJ SOLN
10.0000 mg | Freq: Once | INTRAMUSCULAR | Status: AC
  Administered 2019-12-05: 10 mg via INTRAVENOUS
  Filled 2019-12-05: qty 1

## 2019-12-05 MED ORDER — ARTHREX ANGEL - ACD-A SOLUTION (CHARTING ONLY) OPTIME
TOPICAL | Status: DC | PRN
  Administered 2019-12-05: 10 mL via TOPICAL

## 2019-12-05 SURGICAL SUPPLY — 64 items
BAG DECANTER FOR FLEXI CONT (MISCELLANEOUS) ×2 IMPLANT
BASKET BONE COLLECTION (BASKET) ×2 IMPLANT
BENZOIN TINCTURE PRP APPL 2/3 (GAUZE/BANDAGES/DRESSINGS) ×2 IMPLANT
BLADE CLIPPER SURG (BLADE) ×1 IMPLANT
BONE CANC CHIPS 20CC PCAN1/4 (Bone Implant) ×2 IMPLANT
BUR CARBIDE MATCH 3.0 (BURR) ×2 IMPLANT
CANISTER SUCT 3000ML PPV (MISCELLANEOUS) ×2 IMPLANT
CHIPS CANC BONE 20CC PCAN1/4 (Bone Implant) ×1 IMPLANT
CNTNR URN SCR LID CUP LEK RST (MISCELLANEOUS) ×1 IMPLANT
CONT SPEC 4OZ STRL OR WHT (MISCELLANEOUS) ×2
COVER BACK TABLE 60X90IN (DRAPES) ×2 IMPLANT
DERMABOND ADVANCED (GAUZE/BANDAGES/DRESSINGS) ×1
DERMABOND ADVANCED .7 DNX12 (GAUZE/BANDAGES/DRESSINGS) ×1 IMPLANT
DRAPE C-ARM 42X72 X-RAY (DRAPES) ×2 IMPLANT
DRAPE C-ARMOR (DRAPES) ×2 IMPLANT
DRAPE LAPAROTOMY 100X72X124 (DRAPES) ×2 IMPLANT
DRAPE SURG 17X23 STRL (DRAPES) ×2 IMPLANT
DRSG OPSITE POSTOP 4X6 (GAUZE/BANDAGES/DRESSINGS) ×1 IMPLANT
DURAPREP 26ML APPLICATOR (WOUND CARE) ×2 IMPLANT
ELECT REM PT RETURN 9FT ADLT (ELECTROSURGICAL) ×2
ELECTRODE REM PT RTRN 9FT ADLT (ELECTROSURGICAL) ×1 IMPLANT
EVACUATOR 1/8 PVC DRAIN (DRAIN) ×1 IMPLANT
GLOVE BIO SURGEON STRL SZ7 (GLOVE) ×2 IMPLANT
GLOVE BIO SURGEON STRL SZ8 (GLOVE) ×4 IMPLANT
GLOVE BIOGEL PI IND STRL 7.0 (GLOVE) IMPLANT
GLOVE BIOGEL PI IND STRL 7.5 (GLOVE) IMPLANT
GLOVE BIOGEL PI INDICATOR 7.0 (GLOVE) ×4
GLOVE BIOGEL PI INDICATOR 7.5 (GLOVE) ×2
GLOVE SURG SS PI 6.5 STRL IVOR (GLOVE) ×2 IMPLANT
GLOVE SURG SS PI 7.0 STRL IVOR (GLOVE) ×3 IMPLANT
GOWN STRL REUS W/ TWL LRG LVL3 (GOWN DISPOSABLE) IMPLANT
GOWN STRL REUS W/ TWL XL LVL3 (GOWN DISPOSABLE) ×2 IMPLANT
GOWN STRL REUS W/TWL 2XL LVL3 (GOWN DISPOSABLE) IMPLANT
GOWN STRL REUS W/TWL LRG LVL3 (GOWN DISPOSABLE) ×6
GOWN STRL REUS W/TWL XL LVL3 (GOWN DISPOSABLE) ×6
GRAFT BNE CANC CHIPS 1-8 20CC (Bone Implant) IMPLANT
HEMOSTAT POWDER KIT SURGIFOAM (HEMOSTASIS) ×1 IMPLANT
IDENTI PS 11X9X25 15D (Spacer) ×4 IMPLANT
KIT BASIN OR (CUSTOM PROCEDURE TRAY) ×2 IMPLANT
KIT TURNOVER KIT B (KITS) ×2 IMPLANT
MILL MEDIUM DISP (BLADE) ×1 IMPLANT
NDL HYPO 25X1 1.5 SAFETY (NEEDLE) ×1 IMPLANT
NEEDLE HYPO 25X1 1.5 SAFETY (NEEDLE) ×2 IMPLANT
NS IRRIG 1000ML POUR BTL (IV SOLUTION) ×2 IMPLANT
PACK LAMINECTOMY NEURO (CUSTOM PROCEDURE TRAY) ×2 IMPLANT
ROD LORD LIPPED TI 5.5X60 (Rod) ×2 IMPLANT
SCREW CANC SHANK MOD 6.5X45 (Screw) ×2 IMPLANT
SCREW CORT SHANK MOD 6.5X40 (Screw) ×4 IMPLANT
SCREW POLYAXIAL TULIP (Screw) ×6 IMPLANT
SET SCREW (Screw) ×12 IMPLANT
SET SCREW SPNE (Screw) IMPLANT
SPACER IDENTI PS 11X9X25 15D (Spacer) IMPLANT
SPACER IDENTITI PS 10X9X25 15D (Spacer) ×2 IMPLANT
SPONGE SURGIFOAM ABS GEL 100 (HEMOSTASIS) ×2 IMPLANT
STRIP CLOSURE SKIN 1/2X4 (GAUZE/BANDAGES/DRESSINGS) ×3 IMPLANT
SUT VIC AB 0 CT1 18XCR BRD8 (SUTURE) ×1 IMPLANT
SUT VIC AB 0 CT1 8-18 (SUTURE) ×4
SUT VIC AB 2-0 CP2 18 (SUTURE) ×3 IMPLANT
SUT VIC AB 3-0 SH 8-18 (SUTURE) ×4 IMPLANT
SYR CONTROL 10ML LL (SYRINGE) ×4 IMPLANT
TOWEL GREEN STERILE (TOWEL DISPOSABLE) ×2 IMPLANT
TOWEL GREEN STERILE FF (TOWEL DISPOSABLE) ×2 IMPLANT
TRAY FOLEY MTR SLVR 16FR STAT (SET/KITS/TRAYS/PACK) ×2 IMPLANT
WATER STERILE IRR 1000ML POUR (IV SOLUTION) ×2 IMPLANT

## 2019-12-05 NOTE — Anesthesia Procedure Notes (Signed)
Procedure Name: Intubation Date/Time: 12/05/2019 8:45 AM Performed by: Inda Coke, CRNA Pre-anesthesia Checklist: Patient identified, Emergency Drugs available, Suction available and Patient being monitored Patient Re-evaluated:Patient Re-evaluated prior to induction Oxygen Delivery Method: Circle System Utilized Preoxygenation: Pre-oxygenation with 100% oxygen Induction Type: IV induction Ventilation: Mask ventilation without difficulty and Oral airway inserted - appropriate to patient size Laryngoscope Size: Mac and 4 Grade View: Grade II Tube type: Oral Tube size: 7.5 mm Number of attempts: 1 Airway Equipment and Method: Stylet and Oral airway Placement Confirmation: ETT inserted through vocal cords under direct vision,  positive ETCO2 and breath sounds checked- equal and bilateral Secured at: 23 cm Tube secured with: Tape Dental Injury: Teeth and Oropharynx as per pre-operative assessment

## 2019-12-05 NOTE — Transfer of Care (Signed)
Immediate Anesthesia Transfer of Care Note  Patient: Juan Long  Procedure(s) Performed: LUMBAR FOUR-FIVE, LUMBAR FIVE-SACRAL ONE POSTERIOR LUMBAR INTERBODY FUSION (N/A Spine Lumbar)  Patient Location: PACU  Anesthesia Type:General  Level of Consciousness: awake and alert   Airway & Oxygen Therapy: Patient Spontanous Breathing and Patient connected to face mask oxygen  Post-op Assessment: Report given to RN, Post -op Vital signs reviewed and stable and Patient moving all extremities X 4  Post vital signs: Reviewed and stable  Last Vitals:  Vitals Value Taken Time  BP 142/85 12/05/19 1406  Temp    Pulse 122 12/05/19 1407  Resp 18 12/05/19 1408  SpO2 98 % 12/05/19 1407  Vitals shown include unvalidated device data.  Last Pain:  Vitals:   12/05/19 0657  TempSrc:   PainSc: 6          Complications: No apparent anesthesia complications

## 2019-12-05 NOTE — Op Note (Signed)
12/05/2019  2:03 PM  PATIENT:  Juan Long  44 y.o. male  PRE-OPERATIVE DIAGNOSIS:  1.  Degenerative spondylolisthesis L5-S1, 2.  Lumbar disc herniation L4-5, 3.  Retrolisthesis L4-5, 4, back and leg pain  POST-OPERATIVE DIAGNOSIS:  same  PROCEDURE:   1. Decompressive lumbar laminectomy medial facetectomy and foraminotomies L4-5 L5-S1 requiring more work than would be required for a simple exposure of the disk for PLIF in order to adequately decompress the neural elements and address the spinal stenosis 2. Posterior lumbar interbody fusion L4-5 L5-S1 using porous titanium interbody cages packed with morcellized allograft and autograft soaked with bone aspirate obtained through a separate fascial incision over the right iliac crest 3. Posterior fixation L4-S1 inclusive using Alphatec cortical pedicle screws.  4. Intertransverse arthrodesis L4-S1 using morcellized autograft and allograft.  SURGEON:  Sherley Bounds, MD  ASSISTANTS: Glenford Peers, FNP  ANESTHESIA:  General  EBL: 350 ml  Total I/O In: 2550 [I.V.:2200; IV Piggyback:350] Out: 4098 [Urine:700; Blood:350]  BLOOD ADMINISTERED:none  DRAINS: none   INDICATION FOR PROCEDURE: This patient presented with back and leg pain for many years. Imaging revealed retrolisthesis L4-5 with a dynamic spondylolisthesis L5-S1 with disc herniation L4-5 and foraminal stenosis L5-S1. The patient tried a reasonable attempt at conservative medical measures without relief. I recommended decompression and instrumented fusion to address the stenosis as well as the segmental  instability.  Patient understood the risks, benefits, and alternatives and potential outcomes and wished to proceed.  PROCEDURE DETAILS:  The patient was brought to the operating room. After induction of generalized endotracheal anesthesia the patient was rolled into the prone position on chest rolls and all pressure points were padded. The patient's lumbar region was cleaned  and then prepped with DuraPrep and draped in the usual sterile fashion. Anesthesia was injected and then a dorsal midline incision was made and carried down to the lumbosacral fascia. The fascia was opened and the paraspinous musculature was taken down in a subperiosteal fashion to expose L4-5 and L5-S1. A self-retaining retractor was placed. Intraoperative fluoroscopy confirmed my level, and I started with placement of the L4 cortical pedicle screws. The pedicle screw entry zones were identified utilizing surface landmarks and  AP and lateral fluoroscopy. I scored the cortex with the high-speed drill and then used the hand drill to drill an upward and outward direction into the pedicle. I then tapped line to line. I then placed a 6.5 x 40 mm cortical pedicle screw into the pedicles of L4 bilaterally.  I then dissected in a suprafascial plane to expose the iliac crest.  Opened the fascia and we used a Jamshidi needle to extract 60 cc of bone marrow aspirate from the iliac crest with the assistance of my nurse practitioner.  This was then spun down by [facility] device and 2 to 4 cc of  BMAC was soaked on morselized allograft for later arthrodesis.  I dried the hole with Surgifoam and closed the fascia.  I then turned my attention to the decompression and complete lumbar laminectomies, hemi- facetectomies, and foraminotomies were performed at L4-5 and L5-S1.  My nurse practitioner was directly involved in the decompression and exposure of the neural elements. the patient had significant spinal stenosis and this required more work than would be required for a simple exposure of the disc for posterior lumbar interbody fusion which would only require a limited laminotomy. Much more generous decompression and generous foraminotomy was undertaken in order to adequately decompress the neural elements and address the  patient's leg pain. The yellow ligament was removed to expose the underlying dura and nerve roots, and generous  foraminotomies were performed to adequately decompress the neural elements. Both the exiting and traversing nerve roots were decompressed on both sides until a coronary dilator passed easily along the nerve roots. Once the decompression was complete, I turned my attention to the posterior lower lumbar interbody fusion. The epidural venous vasculature was coagulated and cut sharply. Disc space was incised and the initial discectomy was performed with pituitary rongeurs. The disc space was distracted with sequential distractors to a height of 11 mm. We then used a series of scrapers and shavers to prepare the endplates for fusion. The midline was prepared with Epstein curettes. Once the complete discectomy was finished, we packed an appropriate sized interbody cage with local autograft and morcellized allograft, gently retracted the nerve root, and tapped the cage into position at L4-5 and L5-S1.  The midline between the cages was packed with morselized autograft and allograft. We then turned our attention to the placement of the lower pedicle screws. The pedicle screw entry zones were identified utilizing surface landmarks and fluoroscopy. I drilled into each pedicle utilizing the hand drill, and tapped each pedicle with the appropriate tap. We palpated with a ball probe to assure no break in the cortex. We then placed 6.5 x 40 mm pedicle screws into the pedicles bilaterally at L5 and S1.  My nurse practitioner assisted in placement of the pedicle screws.  We then decorticated the transverse processes and laid a mixture of morcellized autograft and allograft out over these to perform intertransverse arthrodesis at L4-S1 on the right. We then placed lordotic rods into the multiaxial screw heads of the pedicle screws and locked these in position with the locking caps and anti-torque device. We then checked our construct with AP and lateral fluoroscopy. Irrigated with copious amounts of bacitracin-containing saline  solution. Inspected the nerve roots once again to assure adequate decompression, lined to the dura with Gelfoam, placed powdered vancomycin into the wound, and then we closed the muscle and the fascia with 0 Vicryl. Closed the subcutaneous tissues with 2-0 Vicryl and subcuticular tissues with 3-0 Vicryl. The skin was closed with benzoin and Steri-Strips. Dressing was then applied, the patient was awakened from general anesthesia and transported to the recovery room in stable condition. At the end of the procedure all sponge, needle and instrument counts were correct.   PLAN OF CARE: admit to inpatient  PATIENT DISPOSITION:  PACU - hemodynamically stable.   Delay start of Pharmacological VTE agent (>24hrs) due to surgical blood loss or risk of bleeding:  yes

## 2019-12-05 NOTE — Progress Notes (Signed)
Orthopedic Tech Progress Note Patient Details:  Juan Long 01/18/1976 [id number] Called in order to [facility] for a LSO Patient ID: Juan Long, male   DOB: 26-May-1976, 44 y.o.   MRN: [id number]   Donald Pore 12/05/2019, 3:01 PM

## 2019-12-05 NOTE — H&P (Signed)
Subjective: Patient is a 44 y.o. male admitted for PLIF. Onset of symptoms was several years ago, gradually worsening since that time.  The pain is rated severe, and is located at the across the lower back and radiates to legs. The pain is described as aching and occurs all day. The symptoms have been progressive. Symptoms are exacerbated by exercise. MRI or CT showed spondylosis/ spondylolisthesis L5-S1   Past Medical History:  Diagnosis Date  . Arthritis   . Depression   . Esophageal reflux   . Essential hypertension, benign    many years ago  . Obstructive sleep apnea (adult) (pediatric)   . OSA on CPAP   . Spondylisthesis     Past Surgical History:  Procedure Laterality Date  . ANTERIOR CERVICAL DECOMP/DISCECTOMY FUSION Right 01/04/2018   Procedure: ANTERIOR CERVICAL DECOMPRESSION FUSION CERVICAL FIVE-SIX, CERVICAL SIX-SEVEN WITH INSTRUMENTATION AND ALLOGRAFT;  Surgeon: Estill Bamberg, MD;  Location: [facility] OR;  Service: Orthopedics;  Laterality: Right;  . BACK SURGERY    . CERVICAL SPINE SURGERY    . NASAL SEPTUM SURGERY    . TONSILLECTOMY    . uvula shaved    . VARICOCELE EXCISION      Prior to Admission medications   Medication Sig Start Date End Date Taking? Authorizing Provider  aspirin EC 81 MG tablet Take 81 mg by mouth daily.   Yes [provider]  Cholecalciferol (VITAMIN D3) 50 MCG (2000 UT) TABS Take 2,000 Units by mouth daily.   Yes [provider]  FLUoxetine (PROZAC) 20 MG tablet Take 60 mg by mouth daily.   Yes [provider]  modafinil (PROVIGIL) 100 MG tablet Take 150 mg by mouth in the morning and at bedtime. Morning & lunch   Yes [provider]  vitamin E 180 MG (400 UNITS) capsule Take 400 Units by mouth daily.   Yes [provider]  cyclobenzaprine (FLEXERIL) 10 MG tablet Take 10 mg by mouth 3 (three) times daily as needed for muscle spasms.    [provider]  naproxen (NAPROSYN) 500 MG tablet Take 500 mg by  mouth 2 (two) times daily as needed (pain.).    [provider]   No Known Allergies  Social History   Tobacco Use  . Smoking status: Former Smoker    Types: Cigarettes  . Smokeless tobacco: Former Neurosurgeon  . Tobacco comment: quit 2000  Substance Use Topics  . Alcohol use: Yes    Comment: occasional    Family History  Problem Relation Age of Onset  . Heart attack Father      Review of Systems  Positive ROS: neg  All other systems have been reviewed and were otherwise negative with the exception of those mentioned in the HPI and as above.  Objective: Vital signs in last 24 hours: Temp:  [98.1 F (36.7 C)] 98.1 F (36.7 C) (03/17 0639) Pulse Rate:  [78] 78 (03/17 0639) Resp:  [18] 18 (03/17 0639) BP: (136)/(101) 136/101 (03/17 0639) SpO2:  [97 %] 97 % (03/17 0639) Weight:  [115.7 kg] 115.7 kg (03/17 0639)  General Appearance: Alert, cooperative, no distress, appears stated age Head: Normocephalic, without obvious abnormality, atraumatic Eyes: PERRL, conjunctiva/corneas clear, EOM's intact    Neck: Supple, symmetrical, trachea midline Back: Symmetric, no curvature, ROM normal, no CVA tenderness Lungs:  respirations unlabored Heart: Regular rate and rhythm Abdomen: Soft, non-tender Extremities: Extremities normal, atraumatic, no cyanosis or edema Pulses: 2+ and symmetric all extremities Skin: Skin color, texture, turgor normal,  no rashes or lesions  NEUROLOGIC:   Mental status: Alert and oriented x4,  no aphasia, good attention span, fund of knowledge, and memory Motor Exam - grossly normal Sensory Exam - grossly normal Reflexes: 1+ Coordination - grossly normal Gait - grossly normal Balance - grossly normal Cranial Nerves: I: smell Not tested  II: visual acuity  OS: nl    OD: nl  II: visual fields Full to confrontation  II: pupils Equal, round, reactive to light  III,VII: ptosis None  III,IV,VI: extraocular muscles  Full ROM  V: mastication Normal   V: facial light touch sensation  Normal  V,VII: corneal reflex  Present  VII: facial muscle function - upper  Normal  VII: facial muscle function - lower Normal  VIII: hearing Not tested  IX: soft palate elevation  Normal  IX,X: gag reflex Present  XI: trapezius strength  5/5  XI: sternocleidomastoid strength 5/5  XI: neck flexion strength  5/5  XII: tongue strength  Normal    Data Review Lab Results  Component Value Date   WBC 5.3 11/22/2019   HGB 14.6 11/22/2019   HCT 46.0 11/22/2019   MCV 92.4 11/22/2019   PLT 336 11/22/2019   Lab Results  Component Value Date   NA 140 11/22/2019   K 4.3 11/22/2019   CL 106 11/22/2019   CO2 24 11/22/2019   BUN 11 11/22/2019   CREATININE 1.18 11/22/2019   GLUCOSE 93 11/22/2019   Lab Results  Component Value Date   INR 0.9 11/22/2019    Assessment/Plan:  Estimated body mass index is 35.57 kg/m as calculated from the following:   Height as of this encounter: 5\' 11"  (1.803 m).   Weight as of this encounter: 115.7 kg. Patient admitted for PLIF L4-5 L5-S1. Patient has failed a reasonable attempt at conservative therapy.  I explained the condition and procedure to the patient and answered any questions.  Patient wishes to proceed with procedure as planned. Understands risks/ benefits and typical outcomes of procedure.   Juan Long 12/05/2019 7:44 AM

## 2019-12-05 NOTE — Evaluation (Signed)
Physical Therapy Evaluation Patient Details Name: Juan Long MRN: [id number] DOB: 03-Jul-1976 Today's Date: 12/05/2019   History of Present Illness  pt is a 44 y/o male with pmh significant for spndylisthesis, OSA, cervical surgery, admitted with degenerative spondylolisthesis, L5/S1, lumbar disc herniation L45, s/p decompresion, PLA and PLIF at L45 and L5S1 with morcellized auto/allografting.  Clinical Impression  Pt is close to baseline functioning and should be safe at home with family assist.  Education completed, questions answered. There are no further acute PT needs.  Will sign off at this time.     Follow Up Recommendations No PT follow up    Equipment Recommendations  None recommended by PT    Recommendations for Other Services       Precautions / Restrictions Precautions Precautions: Back Required Braces or Orthoses: Spinal Brace Spinal Brace: Lumbar corset;Applied in supine position Restrictions Weight Bearing Restrictions: No      Mobility  Bed Mobility Overal bed mobility: Needs Assistance Bed Mobility: Rolling;Sidelying to Sit;Sit to Sidelying Rolling: Min guard Sidelying to sit: Min guard     Sit to sidelying: Min guard General bed mobility comments: Instructed in logroll and transitions to/from sitting  Transfers Overall transfer level: Needs assistance Equipment used: None Transfers: Sit to/from Stand Sit to Stand: Supervision            Ambulation/Gait Ambulation/Gait assistance: Supervision Gait Distance (Feet): 280 Feet Assistive device: None Gait Pattern/deviations: Step-through pattern Gait velocity: slower Gait velocity interpretation: <1.8 ft/sec, indicate of risk for recurrent falls General Gait Details: stiff and guarded, but steady and safe without AD  Stairs Stairs: Yes Stairs assistance: Supervision Stair Management: Alternating pattern;Forwards;One rail Right Number of Stairs: 4 General stair comments: safe with  rail  Wheelchair Mobility    Modified Rankin (Stroke Patients Only)       Balance Overall balance assessment: No apparent balance deficits (not formally assessed)                                           Pertinent Vitals/Pain Pain Assessment: Faces Pain Score: 4  Faces Pain Scale: Hurts even more Pain Location: back Pain Descriptors / Indicators: Guarding;Sore Pain Intervention(s): Monitored during session    Home Living Family/patient expects to be discharged to:: Private residence Living Arrangements: Spouse/significant other Available Help at Discharge: Family;Available 24 hours/day Type of Home: House Home Access: Stairs to enter Entrance Stairs-Rails: Psychiatric nurse of Steps: 2 Home Layout: One level Home Equipment: None      Prior Function Level of Independence: Independent               Hand Dominance        Extremity/Trunk Assessment   Upper Extremity Assessment Upper Extremity Assessment: Overall WFL for tasks assessed    Lower Extremity Assessment Lower Extremity Assessment: Overall WFL for tasks assessed       Communication   Communication: No difficulties  Cognition Arousal/Alertness: Awake/alert Behavior During Therapy: WFL for tasks assessed/performed Overall Cognitive Status: Within Functional Limits for tasks assessed                                        General Comments General comments (skin integrity, edema, etc.): pt and wife instructed in back care/prec, log roll/transitions to/from sitting, bracing issues, lifting  restrictions and progression of activity.    Exercises     Assessment/Plan    PT Assessment Patent does not need any further PT services  PT Problem List         PT Treatment Interventions      PT Goals (Current goals can be found in the Care Plan section)  Acute Rehab PT Goals PT Goal Formulation: All assessment and education complete, DC  therapy    Frequency     Barriers to discharge        Co-evaluation               AM-PAC PT "6 Clicks" Mobility  Outcome Measure Help needed turning from your back to your side while in a flat bed without using bedrails?: None Help needed moving from lying on your back to sitting on the side of a flat bed without using bedrails?: None Help needed moving to and from a bed to a chair (including a wheelchair)?: None Help needed standing up from a chair using your arms (e.g., wheelchair or bedside chair)?: None Help needed to walk in hospital room?: None Help needed climbing 3-5 steps with a railing? : None 6 Click Score: 24    End of Session Equipment Utilized During Treatment: Back brace Activity Tolerance: Patient tolerated treatment well Patient left: in bed;with call bell/phone within reach;with family/visitor present Nurse Communication: Mobility status PT Visit Diagnosis: Other abnormalities of gait and mobility (R26.89);Pain Pain - part of body: (back incision)    Time: 9629-5284 PT Time Calculation (min) (ACUTE ONLY): 25 min   Charges:   PT Evaluation $PT Eval Low Complexity: 1 Low PT Treatments $Gait Training: 8-22 mins        12/05/2019  Jacinto Halim., PT Acute Rehabilitation Services [phone]  (pager) [phone]  (office)  [name] 12/05/2019, 6:07 PM

## 2019-12-05 NOTE — Anesthesia Postprocedure Evaluation (Signed)
Anesthesia Post Note  Patient: Juan Long  Procedure(s) Performed: LUMBAR FOUR-FIVE, LUMBAR FIVE-SACRAL ONE POSTERIOR LUMBAR INTERBODY FUSION (N/A Spine Lumbar)     Patient location during evaluation: PACU Anesthesia Type: General Level of consciousness: awake and alert Pain management: pain level controlled Vital Signs Assessment: post-procedure vital signs reviewed and stable Respiratory status: spontaneous breathing, nonlabored ventilation and respiratory function stable Cardiovascular status: blood pressure returned to baseline and stable Postop Assessment: no apparent nausea or vomiting Anesthetic complications: no    Last Vitals:  Vitals:   12/05/19 1442 12/05/19 1501  BP: 110/80 124/81  Pulse: (!) 106 (!) 102  Resp: (!) 8 18  Temp: 36.7 C 36.8 C  SpO2: 93% 95%    Last Pain:  Vitals:   12/05/19 1501  TempSrc: Oral  PainSc:     LLE Motor Response: Purposeful movement (12/05/19 1500) LLE Sensation: Full sensation (12/05/19 1500) RLE Motor Response: Purposeful movement (12/05/19 1500) RLE Sensation: Full sensation (12/05/19 1500)      Lucretia Kern

## 2019-12-06 MED ORDER — OXYCODONE HCL 10 MG PO TABS: 5.0000 mg | ORAL_TABLET | ORAL | 0 refills | Status: DC | PRN

## 2019-12-06 MED FILL — oxyCODONE HCL 10 MG TABS: 10 | 6 days supply | Qty: 30 | Fill #0

## 2019-12-06 NOTE — Discharge Instructions (Signed)
Wound Care Keep incision covered and dry for 2 days.  You may remove outer bandage after two days.  Do not put any creams, lotions, or ointments on incision. Leave steri-strips on neck.  They will fall off by themselves. Activity Walk each and every day, increasing distance each day. No lifting greater than 5 lbs.  Avoid bending, arching, or twisting. No driving for 2 weeks; may ride as a passenger locally. If provided with back brace, wear when out of bed.  It is not necessary to wear in bed. Diet Resume your normal diet.  Return to Work Will be discussed at you follow up appointment. Call Your Doctor If Any of These Occur Redness, drainage, or swelling at the wound.  Temperature greater than 101 degrees. Severe pain not relieved by pain medication. Incision starts to come apart. Follow Up Appt Call today for appointment in 1-2 weeks ([phone]) or for problems.  If you have any hardware placed in your spine, you will need an x-ray before your appointment.

## 2019-12-06 NOTE — Progress Notes (Signed)
Pt doing well. Pt given D/C instructions with verbal understanding. Rx's were sent to pharmacy by MD. Pt's incision is clean and dry with no sign of infection. Pt's IV was removed prior to D/C. Pt D/C'd home via wheelchair per MD order. Pt is stable @ D/C and has no other needs at this time. Irwin Toran, RN  

## 2019-12-06 NOTE — Progress Notes (Signed)
Occupational Therapy Evaluation Patient Details Name: Juan Long MRN: [id number] DOB: 06-15-1976 Today's Date: 12/06/2019    History of Present Illness pt is a 44 y/o male with pmh significant for spndylisthesis, OSA, cervical surgery, admitted with degenerative spondylolisthesis, L5/S1, lumbar disc herniation L45, s/p decompresion, PLA and PLIF at L45 and L5S1 with morcellized auto/allografting.   Clinical Impression   Completed all education regarding compensatory strategies for ADL and functional mobility. Pt able to return demonstrate techniques. Handout provided and reviewed. No further OT needed.     Follow Up Recommendations  No OT follow up;Supervision - Intermittent    Equipment Recommendations  None recommended by OT    Recommendations for Other Services       Precautions / Restrictions Precautions Precautions: Back Precaution Booklet Issued: Yes (comment) Spinal Brace: Lumbar corset Restrictions Weight Bearing Restrictions: No      Mobility Bed Mobility Overal bed mobility: Modified Independent             General bed mobility comments: good technique  Transfers Overall transfer level: Modified independent                    Balance Overall balance assessment: No apparent balance deficits (not formally assessed)                                         ADL either performed or assessed with clinical judgement   ADL Overall ADL's : Needs assistance/impaired                         Toilet Transfer: Modified Independent           Functional mobility during ADLs: Modified independent General ADL Comments: Educated  pt on compensatory strategies for completing ADL adn functional mobility. Pt able to complete figure four positioning to complete dressing. Educated pt on need to sit and use same technique when bathing. Pt reports he can get a shower seat if needed. Recommend use of long handled sponge. Educated on  pericare care and available AE to help prevent bending and twisting if needed. Pt verbalized understanding. Educated on proper set up of items of home t increase adherance to back precautions.     Vision         Perception     Praxis      Pertinent Vitals/Pain Pain Assessment: 0-10 Pain Score: 3  Pain Location: back Pain Descriptors / Indicators: Guarding;Sore Pain Intervention(s): Limited activity within patient's tolerance     Hand Dominance Right   Extremity/Trunk Assessment Upper Extremity Assessment Upper Extremity Assessment: Overall WFL for tasks assessed   Lower Extremity Assessment Lower Extremity Assessment: Defer to PT evaluation   Cervical / Trunk Assessment Cervical / Trunk Assessment: Other exceptions(back surgery)   Communication     Cognition Arousal/Alertness: Awake/alert Behavior During Therapy: WFL for tasks assessed/performed Overall Cognitive Status: Within Functional Limits for tasks assessed                                     General Comments       Exercises     Shoulder Instructions      Home Living Family/patient expects to be discharged to:: Private residence Living Arrangements: Spouse/significant other Available Help at Discharge: Family;Available 24 hours/day Type  of Home: House Home Access: Stairs to enter [employer] of Steps: 2 Entrance Stairs-Rails: Right;Left Home Layout: One level     Bathroom Shower/Tub: Walk-in shower;Tub/shower Psychologist, forensic: Yes How Accessible: Accessible via walker Home Equipment: None          Prior Functioning/Environment Level of Independence: Independent        Comments: on disability        OT Problem List: Decreased knowledge of precautions;Decreased knowledge of use of DME or AE;Pain;Obesity      OT Treatment/Interventions:      OT Goals(Current goals can be found in the care plan section) Acute Rehab OT  Goals Patient Stated Goal: to go home OT Goal Formulation: All assessment and education complete, DC therapy  OT Frequency:     Barriers to D/C:            Co-evaluation              AM-PAC OT "6 Clicks" Daily Activity     Outcome Measure Help from another person eating meals?: None Help from another person taking care of personal grooming?: None Help from another person toileting, which includes using toliet, bedpan, or urinal?: None Help from another person bathing (including washing, rinsing, drying)?: A Little Help from another person to put on and taking off regular upper body clothing?: None Help from another person to put on and taking off regular lower body clothing?: A Little 6 Click Score: 22   End of Session Equipment Utilized During Treatment: Back brace Nurse Communication: Mobility status  Activity Tolerance: Patient tolerated treatment well Patient left: in bed;with call bell/phone within reach  OT Visit Diagnosis: Pain Pain - part of body: (back)                Time: 2878-6767 OT Time Calculation (min): 17 min Charges:  OT General Charges $OT Visit: 1 Visit OT Evaluation $OT Eval Low Complexity: Bernice, OT/L   Acute OT Clinical Specialist Acute Rehabilitation Services Pager [phone] Office [phone]   [facility] 12/06/2019, 9:46 AM

## 2019-12-10 NOTE — Discharge Summary (Signed)
Physician Discharge Summary  Patient ID: Juan Long MRN: [id number] DOB/AGE: Jan 20, 1976 44 y.o.  Admit date: 12/05/2019 Discharge date: 12/10/2019  Admission Diagnoses: DDD/ stenosis    Discharge Diagnoses: same   Discharged Condition: good  Hospital Course: The patient was admitted on 12/05/2019 and taken to the operating room where the patient underwent PLIF. The patient tolerated the procedure well and was taken to the recovery room and then to the floor in stable condition. The hospital course was routine. There were no complications. The wound remained clean dry and intact. Pt had appropriate back soreness. No complaints of leg pain or new N/T/W. The patient remained afebrile with stable vital signs, and tolerated a regular diet. The patient continued to increase activities, and pain was well controlled with oral pain medications.   Consults: None  Significant Diagnostic Studies:  Results for orders placed or performed during the hospital encounter of 12/03/19  SARS CORONAVIRUS 2 (TAT 6-24 HRS) Nasopharyngeal Nasopharyngeal Swab   Specimen: Nasopharyngeal Swab  Result Value Ref Range   SARS Coronavirus 2 NEGATIVE NEGATIVE    Chest 2 View  Result Date: 11/22/2019 CLINICAL DATA:  Preop lumbar fusion. EXAM: CHEST - 2 VIEW COMPARISON:  01/04/2018 FINDINGS: Heart is upper limits normal in size. Lungs are clear. No effusions or acute bony abnormality. IMPRESSION: No active cardiopulmonary disease. Electronically Signed   By: Rolm Baptise M.D.   On: 11/22/2019 13:59   DG Lumbar Spine 2-3 Views  Result Date: 12/05/2019 CLINICAL DATA:  Intraoperative imaging for L4-S1 fusion. EXAM: DG C-ARM 1-60 MIN; LUMBAR SPINE - 2-3 VIEW COMPARISON:  Two views lumbar spine 10/23/2019. FINDINGS: Two fluoroscopic intraoperative spot views demonstrate pedicle screws, stabilization bars and interbody spacers in place at L4-5 and L5-S1. No acute abnormality is identified. IMPRESSION: Intraoperative  imaging for L4-S1 fusion. Electronically Signed   By: Inge Rise M.D.   On: 12/05/2019 14:07   DG C-Arm 1-60 Min  Result Date: 12/05/2019 CLINICAL DATA:  Intraoperative imaging for L4-S1 fusion. EXAM: DG C-ARM 1-60 MIN; LUMBAR SPINE - 2-3 VIEW COMPARISON:  Two views lumbar spine 10/23/2019. FINDINGS: Two fluoroscopic intraoperative spot views demonstrate pedicle screws, stabilization bars and interbody spacers in place at L4-5 and L5-S1. No acute abnormality is identified. IMPRESSION: Intraoperative imaging for L4-S1 fusion. Electronically Signed   By: Inge Rise M.D.   On: 12/05/2019 14:07    Antibiotics:  Anti-infectives (From admission, onward)   Start     Dose/Rate Route Frequency Ordered Stop   12/05/19 2200  ceFAZolin (ANCEF) IVPB 2g/100 mL premix     2 g 200 mL/hr over 30 Minutes Intravenous Every 8 hours 12/05/19 1500 12/06/19 0526   12/05/19 1112  bacitracin 50,000 Units in sodium chloride 0.9 % 500 mL irrigation  Status:  Discontinued       As needed 12/05/19 1113 12/05/19 1359   12/05/19 0815  ceFAZolin (ANCEF) IVPB 2g/100 mL premix     2 g 200 mL/hr over 30 Minutes Intravenous  Once 12/05/19 0809 12/05/19 1250   12/05/19 0700  ceFAZolin (ANCEF) 2-4 GM/100ML-% IVPB    Note to Pharmacy: Cordelia Pen   : cabinet override      12/05/19 0700 12/05/19 0916   11/26/19 0400  ceFAZolin (ANCEF) 3 g in dextrose 5 % 50 mL IVPB     3 g 100 mL/hr over 30 Minutes Intravenous On call to O.R. 11/23/19 7989 11/27/19 0559      Discharge Exam: Blood pressure 123/78, pulse 90, temperature  98.2 F (36.8 C), temperature source Oral, resp. rate 20, height 5\' 11"  (1.803 m), weight 115.7 kg, SpO2 96 %. Neurologic: Grossly normal Dressing dry  Discharge Medications:   Allergies as of 12/06/2019   No Known Allergies     Medication List    TAKE these medications   aspirin EC 81 MG tablet Take 81 mg by mouth daily.   cyclobenzaprine 10 MG tablet Commonly known as:  FLEXERIL Take 10 mg by mouth 3 (three) times daily as needed for muscle spasms.   FLUoxetine 20 MG tablet Commonly known as: PROZAC Take 60 mg by mouth daily.   modafinil 100 MG tablet Commonly known as: PROVIGIL Take 150 mg by mouth in the morning and at bedtime. Morning & lunch   naproxen 500 MG tablet Commonly known as: NAPROSYN Take 500 mg by mouth 2 (two) times daily as needed (pain.).   Oxycodone HCl 10 MG Tabs Take 0.5-1 tablets (5-10 mg total) by mouth every 4 (four) hours as needed for severe pain ((score 7 to 10)).   Vitamin D3 50 MCG (2000 UT) Tabs Take 2,000 Units by mouth daily.   vitamin E 180 MG (400 UNITS) capsule Take 400 Units by mouth daily.       Disposition: home   Final Dx: PLIF   Discharge Instructions     Remove dressing in 72 hours   Complete by: As directed    Call MD for:  difficulty breathing, headache or visual disturbances   Complete by: As directed    Call MD for:  persistant nausea and vomiting   Complete by: As directed    Call MD for:  redness, tenderness, or signs of infection (pain, swelling, redness, odor or green/yellow discharge around incision site)   Complete by: As directed    Call MD for:  severe uncontrolled pain   Complete by: As directed    Call MD for:  temperature >100.4   Complete by: As directed    Diet - low sodium heart healthy   Complete by: As directed    Increase activity slowly   Complete by: As directed       Follow-up Information    12/08/2019, MD. Schedule an appointment as soon as possible for a visit in 2 week(s).   Specialty: Neurosurgery Contact information: [address] [place] [phone]            Signed: [phone] 12/10/2019, 10:12 AM

## 2020-07-28 ENCOUNTER — Other Ambulatory Visit: Payer: Self-pay | Admitting: Neurological Surgery

## 2020-07-28 DIAGNOSIS — M5416 Radiculopathy, lumbar region: Secondary | ICD-10-CM

## 2020-08-06 ENCOUNTER — Ambulatory Visit (HOSPITAL_COMMUNITY)
Admission: RE | Admit: 2020-08-06 | Discharge: 2020-08-06 | Disposition: A | Discharge status: Home / Self Care | Payer: No Typology Code available for payment source | Source: Ambulatory Visit | Attending: Neurological Surgery | Admitting: Neurological Surgery

## 2020-08-06 ENCOUNTER — Encounter (HOSPITAL_COMMUNITY): Payer: Self-pay

## 2020-08-06 ENCOUNTER — Other Ambulatory Visit: Payer: Self-pay

## 2020-08-06 DIAGNOSIS — M5416 Radiculopathy, lumbar region: Secondary | ICD-10-CM | POA: Insufficient documentation

## 2020-08-18 ENCOUNTER — Encounter: Payer: Self-pay | Admitting: Physical Therapy

## 2020-08-18 ENCOUNTER — Ambulatory Visit: Payer: No Typology Code available for payment source | Attending: Neurological Surgery | Admitting: Physical Therapy

## 2020-08-18 ENCOUNTER — Other Ambulatory Visit: Payer: Self-pay

## 2020-08-18 DIAGNOSIS — G8929 Other chronic pain: Secondary | ICD-10-CM | POA: Diagnosis present

## 2020-08-18 DIAGNOSIS — M62830 Muscle spasm of back: Secondary | ICD-10-CM | POA: Diagnosis present

## 2020-08-18 DIAGNOSIS — R293 Abnormal posture: Secondary | ICD-10-CM | POA: Diagnosis present

## 2020-08-18 DIAGNOSIS — R2689 Other abnormalities of gait and mobility: Secondary | ICD-10-CM | POA: Diagnosis present

## 2020-08-18 DIAGNOSIS — M545 Low back pain, unspecified: Secondary | ICD-10-CM | POA: Insufficient documentation

## 2020-08-18 DIAGNOSIS — M6281 Muscle weakness (generalized): Secondary | ICD-10-CM | POA: Insufficient documentation

## 2020-08-18 NOTE — Therapy (Signed)
[facility] Outpatient Rehabilitation [facility] [address] [place], [place], [address] Phone: [phone]   Fax:  [phone]  Physical Therapy Evaluation  Patient Details  Name: Juan Long MRN: [id number] Date of Birth: [birth year] Referring Provider (PT): Dawley, Alan Mulder, DO    Encounter Date: 08/18/2020   PT End of Session - 08/18/20 0921    Visit Number 1    Number of Visits 15    Date for PT Re-Evaluation 10/06/20    Authorization Type [place]    Authorization - Visit Number 1    Authorization - Number of Visits 15    Progress Note Due on Visit 10    PT Start Time 0930    PT Stop Time 1014    PT Time Calculation (min) 44 min    Activity Tolerance Patient tolerated treatment well    Behavior During Therapy [facility] for tasks assessed/performed           Past Medical History:  Diagnosis Date  . Arthritis   . Depression   . Esophageal reflux   . Essential hypertension, benign    many years ago  . Obstructive sleep apnea (adult) (pediatric)   . OSA on CPAP   . Spondylisthesis     Past Surgical History:  Procedure Laterality Date  . ANTERIOR CERVICAL DECOMP/DISCECTOMY FUSION Right 01/04/2018   Procedure: ANTERIOR CERVICAL DECOMPRESSION FUSION CERVICAL FIVE-SIX, CERVICAL SIX-SEVEN WITH INSTRUMENTATION AND ALLOGRAFT;  Surgeon: Estill Bamberg, MD;  Location: [facility] OR;  Service: Orthopedics;  Laterality: Right;  . BACK SURGERY    . CERVICAL SPINE SURGERY    . NASAL SEPTUM SURGERY    . TONSILLECTOMY    . uvula shaved    . VARICOCELE EXCISION      There were no vitals filed for this visit.    Subjective Assessment - 08/18/20 0933    Subjective pt is a 44 y.o M with CC of low back pain bil with R>L. pt notes the pain started a couple months ago with no specific onset. He denies any LE referral and notes the pain stays mostly inthe low back. pt denies any red flags. Since onset the pain seems to progresively getting worse. pt reprots having a number  injectsion the back and noted the last one being a month ago with relief lasting 1-2 days.    Pertinent History hx of L4-L5, L5-S1 fusion 12/05/2019    Limitations Lifting;Standing;Walking    How long can you sit comfortably? 15 - 20 min    How long can you stand comfortably? 15 -20 min    How long can you walk comfortably? 15-20    Diagnostic tests 08/06/2020 CT 1. Postsurgical changes from decompressive lumbar facetectomies andforaminotomies L4-L5 and L5-S1 with posterior lumbar interbodyfusion spanning L4-S1 levels and placement of interbody fusion cagesand transpedicular screws across the instrumented levels. Noevidence of acute hardware failure or loosening.2. Up to 8 mm of fusion cage subsidence is seen in theposteroinferior endplate L5.3. Minimal progression of the Modic type endplate changes of the S1endplate.4. No significant residual spinal canal stenosis or foraminalnarrowing at L4-L5 or L5-S1.5. At most mild foraminal narrowing remains L5-S1.    Patient Stated Goals to get some relief, but to get fusion of the SIJ    Currently in Pain? Yes    Pain Score 7    at worst 10/10   Pain Location Back    Pain Orientation Left;Right    Pain Descriptors / Indicators Burning;Stabbing;Aching    Pain Type Chronic  pain    Pain Onset More than a month ago    Pain Frequency Constant    Aggravating Factors  prolonged walking, standing and sitting,    Pain Relieving Factors hot tub, heat    Effect of Pain on Daily Activities limited endurance with standing/ walking, and limited positional tolerance.              [facility] - [facility]18[facility] 0924      Assessment   Medical Diagnosis Sacroiliitis, not elsewhere classified M46.1    Referring Provider (PT) Dawley, Alan Mulderroy C, DO     Onset Date/Surgical Date --   2-3 months ago   Hand Dominance Right    Next MD Visit unsure    Prior Therapy yes   for neck, low back      Precautions   Precautions None      Restrictions   Weight Bearing  Restrictions No      Balance Screen   Has the patient fallen in the past 6 months No      Home Environment   Living Environment Private residence    Living Arrangements Spouse/significant other;Children    Type of Home House    Home Access Level entry    Home Layout One level    Home Equipment None      Prior Function   Level of Independence Independent with basic ADLs    Vocation Retired    Leisure going to the [employer]      Cognition   Overall Cognitive Status Within Functional Limits for tasks assessed      Observation/Other Assessments   Focus on Therapeutic Outcomes (FOTO)  66% limited   predicted 45%      Posture/Postural Control   Posture/Postural Control Postural limitations    Postural Limitations Rounded Shoulders;Forward head;Decreased lumbar lordosis      ROM / Strength   AROM / PROM / Strength AROM;Strength;PROM      AROM   AROM Assessment Site Lumbar    Lumbar Flexion 38   reproduction of concordant symptoms at R SIJ   Lumbar Extension 8   reproduction of concordant symptoms at R SIJ   Lumbar - Right Side Bend 18    Lumbar - Left Side Bend 18      Strength   Strength Assessment Site Hip;Knee    Right/Left Hip Right;Left    Right Hip Flexion 4-/5    Right Hip ABduction 4-/5    Right Hip ADduction 5/5    Left Hip Flexion 4+/5    Left Hip ABduction 4-/5    Left Hip ADduction 5/5    Right/Left Knee Left;Right    Right Knee Flexion 4+/5   produced concordant pain in R low back   Right Knee Extension 4+/5    Left Knee Flexion 4+/5    Left Knee Extension 4+/5      Palpation   Palpation comment TTP along the bil lumbar paraspinals with R>L and  along fortins area       Special Tests    Special Tests Sacrolliac Tests    Sacroiliac Tests  Gaenslen's Test   thigh thrust (positive) RLE     Gaenslen's test   Findings Positive    Side  Right      Transfers   Transfers Sit to Stand    Sit to Stand With armrests   extending the RLE out before standing      Ambulation/Gait   Ambulation/Gait Yes  Assistive device None    Gait Pattern Step-through pattern;Decreased stride length;Trendelenburg;Antalgic                      Objective measurements completed on examination: See above findings.       [facility] Adult PT Treatment/Exercise - 08/18/20 0924      Exercises   Exercises Lumbar      Lumbar Exercises: Stretches   Passive Hamstring Stretch 2 reps;30 seconds;Right   improved sit to stand transfer after stretching     Lumbar Exercises: Supine   Other Supine Lumbar Exercises resisted hip abduction 5 x 10 sec hold   noted some relief of low back pain     Manual Therapy   Manual Therapy Muscle Energy Technique    Muscle Energy Technique shot-gun technique x 2                  PT Education - 08/18/20 0948    Education Details evaluation findings, POC, goals, HEp with proper form/ rationale. anatomy of the SIJ and effects of surrounding musculature    Person(s) Educated Patient    Methods Explanation;Verbal cues;Handout    Comprehension Verbalized understanding;Verbal cues required            PT Short Term Goals - 08/18/20 1030      PT SHORT TERM GOAL #1   Title pt to be IND with initial HEP    Time 3    Period Weeks    Status New    Target Date 09/08/20      PT SHORT TERM GOAL #2   Title pt to verbalize/ demo efficient posture and lifting mechancis to reduce and prevent low back/ SIJ pain    Time 3    Period Weeks    Status New    Target Date 09/08/20             PT Long Term Goals - 08/18/20 1031      PT LONG TERM GOAL #1   Title pt to maintain trunk ext, and increase flexion by >/= 10 degrees with </= 5/10 max pain for function mobility required for ADLs    Time 6    Period Weeks    Status New    Target Date 09/29/20      PT LONG TERM GOAL #2   Title pt to be able to sit, stand and walk for >/= 30 min with </= 5/10 max pain for in home amb and short community distances.    Time 6     Period Weeks    Status New    Target Date 09/29/20      PT LONG TERM GOAL #3   Title increase gross hip strength to >/= 4+/5 to promote hip stability and maximize posture and lifting mechanic efficency    Time 6    Period Weeks    Status New    Target Date 09/29/20      PT LONG TERM GOAL #4   Title increase FOTO score to </= 45% limited to demo improvement in function    Time 6    Period Weeks    Status New    Target Date 09/29/20      PT LONG TERM GOAL #5   Title pt to be IND with all HEP given and is able to maintain and progress current level of function IND    Time 6    Period Weeks    Status New  Target Date 09/29/20                  Plan - 08/18/20 1696    Clinical Impression Statement pt presents to OPPT with CC of  Rlow back pain with fluctuating radiating symptoms to the L low back. pt has hx or L4-L5, L5- S1 fusion noting limited relief and notes interesting in an SIJ fusion. He demonstrates limited trunk mobility secondary to guarding and pain noted reproduction of symptoms with flexion and extension. general weakness noted  with reproduction of concordant symptoms with hamstring MMT. special testing and objective findings are consistent with Dx. he would benefit from physical therapy to reduce low back spasm/ pain, improve ROM, increase LE strength, promote efficient posture and maximize his function by addressing the defticits listed.    Personal Factors and Comorbidities Comorbidity 2    Comorbidities hx of depression, OA, hx of mulitple fusions    Examination-Activity Limitations Bed Mobility;Sit;Lift;Stand;Bend    Stability/Clinical Decision Making Evolving/Moderate complexity    Clinical Decision Making Moderate    Rehab Potential Good    PT Frequency 2x / week    PT Duration --   7 weeks   PT Treatment/Interventions ADLs/Self Care Home Management;Electrical Stimulation;Iontophoresis 4mg /ml Dexamethasone;Moist Heat;Traction;Ultrasound;Stair  training;Gait training;Therapeutic activities;Therapeutic exercise;Balance training;Neuromuscular re-education;Manual techniques;Dry needling;Passive range of motion;Taping;Patient/family education    PT Next Visit Plan review/ update HEP PRN, review FOTO assessment, R SIJ hamstring stretching, SLR and gross hip strengthening/ stability, STW for low back/ hamstrings. posture education.    PT Home Exercise Plan [id number] - hamstring stretch, SLR, LTR, sidelying hip abduction    Consulted and Agree with Plan of Care Patient           Patient will benefit from skilled therapeutic intervention in order to improve the following deficits and impairments:  Improper body mechanics, Increased muscle spasms, Decreased strength, Pain, Decreased endurance, Decreased activity tolerance, Postural dysfunction, Decreased range of motion  Visit Diagnosis: Chronic bilateral low back pain without sciatica  Muscle spasm of back  Abnormal posture  Other abnormalities of gait and mobility  Muscle weakness (generalized)     Problem List Patient Active Problem List   Diagnosis Date Noted  . S/P lumbar fusion 12/05/2019  . Radiculopathy 01/04/2018    01/06/2018 PT, DPT, LAT, ATC  08/18/20  10:36 AM      [facility] Out[facility] [address] [place], [place], [place] Phone: [phone]   Fax:  [phone]  Name: DAMAR PETIT MRN: Andria Rhein Date of Birth: [birth year]

## 2020-08-26 ENCOUNTER — Ambulatory Visit: Payer: No Typology Code available for payment source | Attending: Neurological Surgery | Admitting: Physical Therapy

## 2020-08-26 ENCOUNTER — Encounter: Payer: Self-pay | Admitting: Physical Therapy

## 2020-08-26 ENCOUNTER — Other Ambulatory Visit: Payer: Self-pay

## 2020-08-26 DIAGNOSIS — M545 Low back pain, unspecified: Secondary | ICD-10-CM

## 2020-08-26 DIAGNOSIS — R2689 Other abnormalities of gait and mobility: Secondary | ICD-10-CM | POA: Diagnosis present

## 2020-08-26 DIAGNOSIS — M6281 Muscle weakness (generalized): Secondary | ICD-10-CM | POA: Diagnosis present

## 2020-08-26 DIAGNOSIS — M62830 Muscle spasm of back: Secondary | ICD-10-CM | POA: Diagnosis not present

## 2020-08-26 DIAGNOSIS — R293 Abnormal posture: Secondary | ICD-10-CM | POA: Diagnosis present

## 2020-08-26 DIAGNOSIS — G8929 Other chronic pain: Secondary | ICD-10-CM | POA: Insufficient documentation

## 2020-08-26 NOTE — Therapy (Cosign Needed Addendum)
[facility] Outpatient Rehabilitation [facility] [address] [place], [place], [address] Phone: [phone]   Fax:  [phone]  Physical Therapy Treatment  Patient Details  Name: Juan Long MRN: [id number] Date of Birth: 18-Apr-1976 Referring Provider (PT): Dawley, Alan Mulder, DO    Encounter Date: 08/26/2020   PT End of Session - 08/26/20 1059    Visit Number 2    Number of Visits 15    Date for PT Re-Evaluation 10/06/20    Authorization Type [facility]    Authorization - Visit Number 2    Authorization - Number of Visits 15    Progress Note Due on Visit 10    PT Start Time 1015    PT Stop Time 1058    PT Time Calculation (min) 43 min    Activity Tolerance Patient limited by pain    Behavior During Therapy [facility] for tasks assessed/performed           Past Medical History:  Diagnosis Date  . Arthritis   . Depression   . Esophageal reflux   . Essential hypertension, benign    many years ago  . Obstructive sleep apnea (adult) (pediatric)   . OSA on CPAP   . Spondylisthesis     Past Surgical History:  Procedure Laterality Date  . ANTERIOR CERVICAL DECOMP/DISCECTOMY FUSION Right 01/04/2018   Procedure: ANTERIOR CERVICAL DECOMPRESSION FUSION CERVICAL FIVE-SIX, CERVICAL SIX-SEVEN WITH INSTRUMENTATION AND ALLOGRAFT;  Surgeon: Estill Bamberg, MD;  Location: [facility] OR;  Service: Orthopedics;  Laterality: Right;  . BACK SURGERY    . CERVICAL SPINE SURGERY    . NASAL SEPTUM SURGERY    . TONSILLECTOMY    . uvula shaved    . VARICOCELE EXCISION      There were no vitals filed for this visit.   Subjective Assessment - 08/26/20 1012    Subjective "I'm here. Still in pain but that's part of life. The only position when I get relief is sleeping on my side with my knees bent or laying in my recliner with my legs up."    Pertinent History hx of L4-L5, L5-S1 fusion 12/05/2019    Limitations Lifting;Standing;Walking    How long can you sit comfortably? 15 - 20 min    How  long can you stand comfortably? 15 -20 min    How long can you walk comfortably? 15-20    Diagnostic tests 08/06/2020 CT 1. Postsurgical changes from decompressive lumbar facetectomies andforaminotomies L4-L5 and L5-S1 with posterior lumbar interbodyfusion spanning L4-S1 levels and placement of interbody fusion cagesand transpedicular screws across the instrumented levels. Noevidence of acute hardware failure or loosening.2. Up to 8 mm of fusion cage subsidence is seen in theposteroinferior endplate L5.3. Minimal progression of the Modic type endplate changes of the S1endplate.4. No significant residual spinal canal stenosis or foraminalnarrowing at L4-L5 or L5-S1.5. At most mild foraminal narrowing remains L5-S1.    Patient Stated Goals to get some relief, but to get fusion of the SIJ    Currently in Pain? Yes    Pain Score 8     Pain Location Back    Pain Orientation Right;Left    Pain Descriptors / Indicators Aching    Pain Type Chronic pain    Pain Onset More than a month ago                             [facility] Adult PT Treatment/Exercise - 08/26/20 0001  Exercises   Exercises Lumbar      Lumbar Exercises: Stretches   Passive Hamstring Stretch 2 reps;30 seconds;Right    Passive Hamstring Stretch Limitations Seated, well tolerated    Lower Trunk Rotation 5 reps   2x6   Lower Trunk Rotation Limitations Pt reports no change in symptoms and pain increases with increased ROM, okay in small ROM    Piriformis Stretch Right;Left;2 reps;30 seconds    Other Lumbar Stretch Exercise Seated forward flexion with swiss ball 10sx8   no change in pain report     Lumbar Exercises: Aerobic   Nustep L4 Ue & LE, 5 min, arms at 10      Lumbar Exercises: Supine   Straight Leg Raise 5 reps   bilateral    Straight Leg Raises Limitations Pt reported increased pain; very small ROM (approximately 6-12 in off mat)      Lumbar Exercises: Sidelying   Hip Abduction 10 reps;Right    increased pain     Manual Therapy   Manual Therapy Muscle Energy Technique;Soft tissue mobilization    Manual therapy comments PT assisted QL stretch in sidelying 30s (no relief), STW to lumbar paraspinals and right glute med (no change)    Muscle Energy Technique shot-gun technique x 2                  PT Education - 08/26/20 1218    Education Details Reviewed HEP, PT POC as it relates to possible SIJ fusion and conservative treatment first.    Person(s) Educated Patient    Methods Explanation;Demonstration    Comprehension Verbalized understanding;Returned demonstration            PT Short Term Goals - 08/18/20 1030      PT SHORT TERM GOAL #1   Title pt to be IND with initial HEP    Time 3    Period Weeks    Status New    Target Date 09/08/20      PT SHORT TERM GOAL #2   Title pt to verbalize/ demo efficient posture and lifting mechancis to reduce and prevent low back/ SIJ pain    Time 3    Period Weeks    Status New    Target Date 09/08/20             PT Long Term Goals - 08/18/20 1031      PT LONG TERM GOAL #1   Title pt to maintain trunk ext, and increase flexion by >/= 10 degrees with </= 5/10 max pain for function mobility required for ADLs    Time 6    Period Weeks    Status New    Target Date 09/29/20      PT LONG TERM GOAL #2   Title pt to be able to sit, stand and walk for >/= 30 min with </= 5/10 max pain for in home amb and short community distances.    Time 6    Period Weeks    Status New    Target Date 09/29/20      PT LONG TERM GOAL #3   Title increase gross hip strength to >/= 4+/5 to promote hip stability and maximize posture and lifting mechanic efficency    Time 6    Period Weeks    Status New    Target Date 09/29/20      PT LONG TERM GOAL #4   Title increase FOTO score to </= 45% limited to demo improvement in  function    Time 6    Period Weeks    Status New    Target Date 09/29/20      PT LONG TERM GOAL #5   Title pt  to be IND with all HEP given and is able to maintain and progress current level of function IND    Time 6    Period Weeks    Status New    Target Date 09/29/20                 Plan - 08/26/20 1059    Clinical Impression Statement Pt presents to OPPT with high levels of pain in all positions (supine, standing, sitting) with only sidelying with knees bent providing any relief. SIJ mobilizations on right did not provide any change in CC and all HEP exercises increased pain including lower trunk rotations with the exception of hamstring stretch. Pt continues to be motivated to pursue SIJ fusion but is willing to continue PT.    Personal Factors and Comorbidities Comorbidity 2    Comorbidities hx of depression, OA, hx of mulitple fusions    Examination-Activity Limitations Bed Mobility;Sit;Lift;Stand;Bend    Stability/Clinical Decision Making Evolving/Moderate complexity    Rehab Potential Good    PT Frequency 2x / week    PT Duration --   7 weeks   PT Treatment/Interventions ADLs/Self Care Home Management;Electrical Stimulation;Iontophoresis 4mg /ml Dexamethasone;Moist Heat;Traction;Ultrasound;Stair training;Gait training;Therapeutic activities;Therapeutic exercise;Balance training;Neuromuscular re-education;Manual techniques;Dry needling;Passive range of motion;Taping;Patient/family education    PT Next Visit Plan review FOTO assessment, further testing of SIJ (long sit test, sacral thrust), discussion with patient regarding prognosis, R SIJ hamstring stretching, SLR and gross hip strengthening/ stability, STW for low back/ hamstrings. posture education.    PT Home Exercise Plan [id number] - hamstring stretch, SLR, LTR, sidelying hip abduction    Consulted and Agree with Plan of Care Patient           Patient will benefit from skilled therapeutic intervention in order to improve the following deficits and impairments:  Improper body mechanics, Increased muscle spasms, Decreased strength,  Pain, Decreased endurance, Decreased activity tolerance, Postural dysfunction, Decreased range of motion  Visit Diagnosis: Muscle spasm of back  Abnormal posture  Chronic bilateral low back pain without sciatica  Other abnormalities of gait and mobility  Muscle weakness (generalized)     Problem List Patient Active Problem List   Diagnosis Date Noted  . S/P lumbar fusion 12/05/2019  . Radiculopathy 01/04/2018   01/06/2018 PT DPT  08/26/2020 During this treatment session, the therapist was present, participating in and directing the treatment.  14/03/2020, SPT 08/26/2020, 12:31 PM  [facility] [address] [place], [place], [place] Phone: [phone]   Fax:  [phone]  Name: Juan Long MRN: Andria Rhein Date of Birth: 1976/08/21

## 2020-09-04 ENCOUNTER — Ambulatory Visit: Payer: No Typology Code available for payment source | Admitting: Physical Therapy

## 2020-09-04 ENCOUNTER — Encounter: Payer: Self-pay | Admitting: Physical Therapy

## 2020-09-04 ENCOUNTER — Other Ambulatory Visit: Payer: Self-pay

## 2020-09-04 DIAGNOSIS — R2689 Other abnormalities of gait and mobility: Secondary | ICD-10-CM

## 2020-09-04 DIAGNOSIS — M62830 Muscle spasm of back: Secondary | ICD-10-CM | POA: Diagnosis not present

## 2020-09-04 DIAGNOSIS — R293 Abnormal posture: Secondary | ICD-10-CM

## 2020-09-04 DIAGNOSIS — G8929 Other chronic pain: Secondary | ICD-10-CM

## 2020-09-04 DIAGNOSIS — M6281 Muscle weakness (generalized): Secondary | ICD-10-CM

## 2020-09-04 DIAGNOSIS — M545 Low back pain, unspecified: Secondary | ICD-10-CM

## 2020-09-04 NOTE — Therapy (Signed)
[facility] Outpatient Rehabilitation [facility] [address] [place], [place], [address] Phone: [phone]   Fax:  [phone]  Physical Therapy Treatment  Patient Details  Name: Juan Long MRN: [id number] Date of Birth: 22-Sep-1975 Referring Provider (PT): Dawley, Alan Mulder, DO    Encounter Date: 09/04/2020   PT End of Session - 09/04/20 0937    Visit Number 3    Number of Visits 15    Date for PT Re-Evaluation 10/06/20    Authorization Type [place]    Authorization - Visit Number 3    Authorization - Number of Visits 15    Progress Note Due on Visit 10    PT Start Time 0932    PT Stop Time 1018    PT Time Calculation (min) 46 min    Activity Tolerance Patient limited by pain    Behavior During Therapy [facility] for tasks assessed/performed           Past Medical History:  Diagnosis Date  . Arthritis   . Depression   . Esophageal reflux   . Essential hypertension, benign    many years ago  . Obstructive sleep apnea (adult) (pediatric)   . OSA on CPAP   . Spondylisthesis     Past Surgical History:  Procedure Laterality Date  . ANTERIOR CERVICAL DECOMP/DISCECTOMY FUSION Right 01/04/2018   Procedure: ANTERIOR CERVICAL DECOMPRESSION FUSION CERVICAL FIVE-SIX, CERVICAL SIX-SEVEN WITH INSTRUMENTATION AND ALLOGRAFT;  Surgeon: Estill Bamberg, MD;  Location: [facility] OR;  Service: Orthopedics;  Laterality: Right;  . BACK SURGERY    . CERVICAL SPINE SURGERY    . NASAL SEPTUM SURGERY    . TONSILLECTOMY    . uvula shaved    . VARICOCELE EXCISION      There were no vitals filed for this visit.   Subjective Assessment - 09/04/20 0935    Subjective "I am doing about the same, the bad days still out weight the good days"    Diagnostic tests 08/06/2020 CT 1. Postsurgical changes from decompressive lumbar facetectomies andforaminotomies L4-L5 and L5-S1 with posterior lumbar interbodyfusion spanning L4-S1 levels and placement of interbody fusion cagesand transpedicular  screws across the instrumented levels. Noevidence of acute hardware failure or loosening.2. Up to 8 mm of fusion cage subsidence is seen in theposteroinferior endplate L5.3. Minimal progression of the Modic type endplate changes of the S1endplate.4. No significant residual spinal canal stenosis or foraminalnarrowing at L4-L5 or L5-S1.5. At most mild foraminal narrowing remains L5-S1.    Patient Stated Goals to get some relief, but to get fusion of the SIJ    Currently in Pain? Yes    Pain Score 7     Pain Orientation Right;Left    Pain Type Chronic pain    Aggravating Factors  prlonged walkig, standing and sitting    Pain Relieving Factors hot tub, heat              [facility] PT Assessment - 09/04/20 0001      Assessment   Medical Diagnosis Sacroiliitis, not elsewhere classified M46.1    Referring Provider (PT) Dawley, Alan Mulder, DO                          [facility] Adult PT Treatment/Exercise - 09/04/20 0001      Exercises   Exercises --   trialed SIJ belt to promote stability and perform stretching/ exercise     Lumbar Exercises: Stretches   Active Hamstring Stretch 3 reps;Left;Right;30  seconds   PNF contract / relax   Lower Trunk Rotation Limitations 2 x 20 1 set with SI belt and without reporting no change in pain      Modalities   Modalities Electrical Stimulation;Moist Heat      Moist Heat Therapy   Number Minutes Moist Heat 15 Minutes   in supine in decompressed position.   Moist Heat Location Lumbar Spine      Electrical Stimulation   Electrical Stimulation Location low bac    Electrical Stimulation Action IFC    Electrical Stimulation Parameters L15 x 15 min 100% scan    Electrical Stimulation Goals Pain   in supine with MHP     Manual Therapy   Manual Therapy Joint mobilization    Joint Mobilization LAD grade III-IV RLE only    Muscle Energy Technique resisted hip flexor activation on R                  PT Education - 09/04/20 1006     Education Details Reviewed FOTO    Methods Explanation;Verbal cues    Comprehension Verbalized understanding;Verbal cues required            PT Short Term Goals - 08/18/20 1030      PT SHORT TERM GOAL #1   Title pt to be IND with initial HEP    Time 3    Period Weeks    Status New    Target Date 09/08/20      PT SHORT TERM GOAL #2   Title pt to verbalize/ demo efficient posture and lifting mechancis to reduce and prevent low back/ SIJ pain    Time 3    Period Weeks    Status New    Target Date 09/08/20             PT Long Term Goals - 08/18/20 1031      PT LONG TERM GOAL #1   Title pt to maintain trunk ext, and increase flexion by >/= 10 degrees with </= 5/10 max pain for function mobility required for ADLs    Time 6    Period Weeks    Status New    Target Date 09/29/20      PT LONG TERM GOAL #2   Title pt to be able to sit, stand and walk for >/= 30 min with </= 5/10 max pain for in home amb and short community distances.    Time 6    Period Weeks    Status New    Target Date 09/29/20      PT LONG TERM GOAL #3   Title increase gross hip strength to >/= 4+/5 to promote hip stability and maximize posture and lifting mechanic efficency    Time 6    Period Weeks    Status New    Target Date 09/29/20      PT LONG TERM GOAL #4   Title increase FOTO score to </= 45% limited to demo improvement in function    Time 6    Period Weeks    Status New    Target Date 09/29/20      PT LONG TERM GOAL #5   Title pt to be IND with all HEP given and is able to maintain and progress current level of function IND    Time 6    Period Weeks    Status New    Target Date 09/29/20  Plan - 09/04/20 1004    Clinical Impression Statement pt reports no changes in pain since his evaluatin noting compliance with his HEP. continued working on stabilizing SIJ utilzing a SIJ belt and performing exercise which he noted no relief and actually increased pain in  the low back. pt continues to report he really just wants to go ahead and get the fusion.  Utilized MHP and E-stim end of session to calm down pain and inflammation in decompression position.    PT Treatment/Interventions ADLs/Self Care Home Management;Electrical Stimulation;Iontophoresis 4mg /ml Dexamethasone;Moist Heat;Traction;Ultrasound;Stair training;Gait training;Therapeutic activities;Therapeutic exercise;Balance training;Neuromuscular re-education;Manual techniques;Dry needling;Passive range of motion;Taping;Patient/family education    PT Next Visit Plan discussion with patient regarding prognosis, R SIJ hamstring stretching, SLR and gross hip strengthening/ stability, STW for low back/ hamstrings. posture education.    PT Home Exercise Plan [id number] - hamstring stretch, SLR, LTR, sidelying hip abduction    Consulted and Agree with Plan of Care Patient           Patient will benefit from skilled therapeutic intervention in order to improve the following deficits and impairments:  Improper body mechanics,Increased muscle spasms,Decreased strength,Pain,Decreased endurance,Decreased activity tolerance,Postural dysfunction,Decreased range of motion  Visit Diagnosis: Muscle spasm of back  Abnormal posture  Chronic bilateral low back pain without sciatica  Other abnormalities of gait and mobility  Muscle weakness (generalized)     Problem List Patient Active Problem List   Diagnosis Date Noted  . S/P lumbar fusion 12/05/2019  . Radiculopathy 01/04/2018   01/06/2018 PT, DPT, LAT, ATC  09/04/20  10:08 AM      [facility] [address] [place], [place], [place] Phone: [phone]   Fax:  [phone]  Name: MIRAN KAUTZMAN MRN: Andria Rhein Date of Birth: 1975/10/19

## 2020-09-08 ENCOUNTER — Other Ambulatory Visit: Payer: Self-pay

## 2020-09-08 ENCOUNTER — Ambulatory Visit: Payer: No Typology Code available for payment source | Admitting: Physical Therapy

## 2020-09-08 ENCOUNTER — Encounter: Payer: Self-pay | Admitting: Physical Therapy

## 2020-09-08 DIAGNOSIS — M62830 Muscle spasm of back: Secondary | ICD-10-CM

## 2020-09-08 DIAGNOSIS — G8929 Other chronic pain: Secondary | ICD-10-CM

## 2020-09-08 DIAGNOSIS — R293 Abnormal posture: Secondary | ICD-10-CM

## 2020-09-08 DIAGNOSIS — M6281 Muscle weakness (generalized): Secondary | ICD-10-CM

## 2020-09-08 DIAGNOSIS — R2689 Other abnormalities of gait and mobility: Secondary | ICD-10-CM

## 2020-09-08 DIAGNOSIS — M545 Low back pain, unspecified: Secondary | ICD-10-CM

## 2020-09-08 NOTE — Therapy (Signed)
[facility] Outpatient Rehabilitation [facility] [address] [place], [place], [address] Phone: [phone]   Fax:  [phone]  Physical Therapy Treatment  Patient Details  Name: Juan Long MRN: [id number] Date of Birth: [birth year] Referring Provider (PT): Dawley, Alan Mulder, DO    Encounter Date: 09/08/2020   PT End of Session - 09/08/20 0935    Visit Number 4    Number of Visits 15    Date for PT Re-Evaluation 10/06/20    Authorization Type [facility]    Authorization - Visit Number 4    Authorization - Number of Visits 15    Progress Note Due on Visit 10    PT Start Time 0935   pt arrived late today   PT Stop Time 1015    PT Time Calculation (min) 40 min    Activity Tolerance Patient limited by pain    Behavior During Therapy [facility] for tasks assessed/performed           Past Medical History:  Diagnosis Date  . Arthritis   . Depression   . Esophageal reflux   . Essential hypertension, benign    many years ago  . Obstructive sleep apnea (adult) (pediatric)   . OSA on CPAP   . Spondylisthesis     Past Surgical History:  Procedure Laterality Date  . ANTERIOR CERVICAL DECOMP/DISCECTOMY FUSION Right 01/04/2018   Procedure: ANTERIOR CERVICAL DECOMPRESSION FUSION CERVICAL FIVE-SIX, CERVICAL SIX-SEVEN WITH INSTRUMENTATION AND ALLOGRAFT;  Surgeon: Estill Bamberg, MD;  Location: [facility] OR;  Service: Orthopedics;  Laterality: Right;  . BACK SURGERY    . CERVICAL SPINE SURGERY    . NASAL SEPTUM SURGERY    . TONSILLECTOMY    . uvula shaved    . VARICOCELE EXCISION      There were no vitals filed for this visit.   Subjective Assessment - 09/08/20 0936    Subjective "After the last session I was feeling alittle better, the L side is bothering me more today."    Diagnostic tests 08/06/2020 CT 1. Postsurgical changes from decompressive lumbar facetectomies andforaminotomies L4-L5 and L5-S1 with posterior lumbar interbodyfusion spanning L4-S1 levels and placement of  interbody fusion cagesand transpedicular screws across the instrumented levels. Noevidence of acute hardware failure or loosening.2. Up to 8 mm of fusion cage subsidence is seen in theposteroinferior endplate L5.3. Minimal progression of the Modic type endplate changes of the S1endplate.4. No significant residual spinal canal stenosis or foraminalnarrowing at L4-L5 or L5-S1.5. At most mild foraminal narrowing remains L5-S1.    Currently in Pain? Yes    Pain Score 7     Pain Location Back    Pain Orientation Right;Left    Pain Descriptors / Indicators Aching;Sore;Tightness    Pain Onset More than a month ago    Pain Frequency Intermittent              [facility] PT Assessment - 09/08/20 0001      Assessment   Medical Diagnosis Sacroiliitis, not elsewhere classified M46.1    Referring Provider (PT) Dawley, Alan Mulder, DO                          [facility] Adult PT Treatment/Exercise - 09/08/20 0001      Lumbar Exercises: Aerobic   Nustep L4 x 5 min UE/LE      Lumbar Exercises: Seated   Other Seated Lumbar Exercises pressing down bil UE using black physioball 1 x 20 holding 5 seconds  Other Seated Lumbar Exercises seated marching 2 x 20 with green theraband around knees   cues to avoid leaning backward     Lumbar Exercises: Supine   Other Supine Lumbar Exercises adductor strengthening 2 x 15 using pilates ring    Other Supine Lumbar Exercises Clam shell 2 x 15 with green theraband      Insurance claims handler Stimulation Location low bac    Electrical Stimulation Action IFC    Electrical Stimulation Parameters L15 x 15 min    Electrical Stimulation Goals Pain      Manual Therapy   Joint Mobilization LAD grade III-IV RLE only                    PT Short Term Goals - 08/18/20 1030      PT SHORT TERM GOAL #1   Title pt to be IND with initial HEP    Time 3    Period Weeks    Status New    Target Date 09/08/20      PT SHORT TERM GOAL #2   Title  pt to verbalize/ demo efficient posture and lifting mechancis to reduce and prevent low back/ SIJ pain    Time 3    Period Weeks    Status New    Target Date 09/08/20             PT Long Term Goals - 08/18/20 1031      PT LONG TERM GOAL #1   Title pt to maintain trunk ext, and increase flexion by >/= 10 degrees with </= 5/10 max pain for function mobility required for ADLs    Time 6    Period Weeks    Status New    Target Date 09/29/20      PT LONG TERM GOAL #2   Title pt to be able to sit, stand and walk for >/= 30 min with </= 5/10 max pain for in home amb and short community distances.    Time 6    Period Weeks    Status New    Target Date 09/29/20      PT LONG TERM GOAL #3   Title increase gross hip strength to >/= 4+/5 to promote hip stability and maximize posture and lifting mechanic efficency    Time 6    Period Weeks    Status New    Target Date 09/29/20      PT LONG TERM GOAL #4   Title increase FOTO score to </= 45% limited to demo improvement in function    Time 6    Period Weeks    Status New    Target Date 09/29/20      PT LONG TERM GOAL #5   Title pt to be IND with all HEP given and is able to maintain and progress current level of function IND    Time 6    Period Weeks    Status New    Target Date 09/29/20                 Plan - 09/08/20 0954    Clinical Impression Statement pt arrives noting some relief after the last session for alittle bit, but reported the pain returned shortly after. Focused session on hip and core strengthening with increased reps to promote hip stability. continued using E-stim and MHP end of session to calm down pain.    PT Next Visit Plan discussion with patient regarding  prognosis,  gross hip / knee strengthening. R SIJ hamstring stretching, SLR and gross hip strengthening/ stability, STW for low back/ hamstrings. posture education.    PT Home Exercise Plan [id number] - hamstring stretch, SLR, LTR, sidelying hip  abduction    Consulted and Agree with Plan of Care Patient           Patient will benefit from skilled therapeutic intervention in order to improve the following deficits and impairments:  Improper body mechanics,Increased muscle spasms,Decreased strength,Pain,Decreased endurance,Decreased activity tolerance,Postural dysfunction,Decreased range of motion  Visit Diagnosis: Muscle spasm of back  Abnormal posture  Chronic bilateral low back pain without sciatica  Other abnormalities of gait and mobility  Muscle weakness (generalized)     Problem List Patient Active Problem List   Diagnosis Date Noted  . S/P lumbar fusion 12/05/2019  . Radiculopathy 01/04/2018   Lulu Riding PT, DPT, LAT, ATC  09/08/20  10:08 AM      [facility] Out[facility] Rehabilitation [facility] [address] [place], [place], [address] Phone: [phone]   Fax:  [phone]  Name: DAT DERKSEN MRN: [id number] Date of Birth: [birth year]

## 2020-09-10 ENCOUNTER — Encounter: Payer: Self-pay | Admitting: Physical Therapy

## 2020-09-10 ENCOUNTER — Other Ambulatory Visit: Payer: Self-pay

## 2020-09-10 ENCOUNTER — Ambulatory Visit: Payer: No Typology Code available for payment source | Admitting: Physical Therapy

## 2020-09-10 DIAGNOSIS — G8929 Other chronic pain: Secondary | ICD-10-CM

## 2020-09-10 DIAGNOSIS — R293 Abnormal posture: Secondary | ICD-10-CM

## 2020-09-10 DIAGNOSIS — M545 Low back pain, unspecified: Secondary | ICD-10-CM

## 2020-09-10 DIAGNOSIS — M62830 Muscle spasm of back: Secondary | ICD-10-CM | POA: Diagnosis not present

## 2020-09-10 DIAGNOSIS — R2689 Other abnormalities of gait and mobility: Secondary | ICD-10-CM

## 2020-09-10 DIAGNOSIS — M6281 Muscle weakness (generalized): Secondary | ICD-10-CM

## 2020-09-10 NOTE — Therapy (Signed)
[facility] Outpatient Rehabilitation [facility] [address] [place], [place], [address] Phone: [phone]   Fax:  [phone]  Physical Therapy Treatment  Patient Details  Name: Juan Long MRN: [id number] Date of Birth: [birth year] Referring Provider (PT): Dawley, Alan Mulder, DO    Encounter Date: 09/10/2020   PT End of Session - 09/10/20 0929    Visit Number 5    Number of Visits 15    Date for PT Re-Evaluation 10/06/20    Authorization Type [facility]    Authorization - Visit Number 5    Authorization - Number of Visits 15    Progress Note Due on Visit 10    PT Start Time 0930    PT Stop Time 1018    PT Time Calculation (min) 48 min    Activity Tolerance Patient limited by pain    Behavior During Therapy [facility] for tasks assessed/performed           Past Medical History:  Diagnosis Date  . Arthritis   . Depression   . Esophageal reflux   . Essential hypertension, benign    many years ago  . Obstructive sleep apnea (adult) (pediatric)   . OSA on CPAP   . Spondylisthesis     Past Surgical History:  Procedure Laterality Date  . ANTERIOR CERVICAL DECOMP/DISCECTOMY FUSION Right 01/04/2018   Procedure: ANTERIOR CERVICAL DECOMPRESSION FUSION CERVICAL FIVE-SIX, CERVICAL SIX-SEVEN WITH INSTRUMENTATION AND ALLOGRAFT;  Surgeon: Estill Bamberg, MD;  Location: [facility] OR;  Service: Orthopedics;  Laterality: Right;  . BACK SURGERY    . CERVICAL SPINE SURGERY    . NASAL SEPTUM SURGERY    . TONSILLECTOMY    . uvula shaved    . VARICOCELE EXCISION      There were no vitals filed for this visit.   Subjective Assessment - 09/10/20 0930    Subjective "I am still feeling about the same."    Diagnostic tests 08/06/2020 CT 1. Postsurgical changes from decompressive lumbar facetectomies andforaminotomies L4-L5 and L5-S1 with posterior lumbar interbodyfusion spanning L4-S1 levels and placement of interbody fusion cagesand transpedicular screws across the instrumented levels.  Noevidence of acute hardware failure or loosening.2. Up to 8 mm of fusion cage subsidence is seen in theposteroinferior endplate L5.3. Minimal progression of the Modic type endplate changes of the S1endplate.4. No significant residual spinal canal stenosis or foraminalnarrowing at L4-L5 or L5-S1.5. At most mild foraminal narrowing remains L5-S1.    Currently in Pain? Yes    Pain Score 7     Pain Location Back    Pain Orientation Right    Pain Descriptors / Indicators Aching;Sore    Pain Frequency Constant    Aggravating Factors  prolonged walking/ standing, sitting              [facility] PT Assessment - 09/10/20 0001      Assessment   Medical Diagnosis Sacroiliitis, not elsewhere classified M46.1    Referring Provider (PT) Dawley, Alan Mulder, DO                          [facility] Adult PT Treatment/Exercise - 09/10/20 0942      Lumbar Exercises: Supine   Pelvic Tilt 10 reps;5 seconds    Bent Knee Raise 20 reps   alternating L/R combined with PPT, x 2 sets   Bridge 10 reps;Other (comment)   x 2 sets while maintaining PPT   Other Supine Lumbar Exercises ressing ball down with bnil  UE onto thighs in hooklying position 2 x 10 holding 5 seconds   exhaling while pressing and inhaling with release     Lumbar Exercises: Sidelying   Hip Abduction Both   x 2 sets going to fatigue     Moist Heat Therapy   Number Minutes Moist Heat 10 Minutes    Moist Heat Location Lumbar Spine   in supine     Electrical Stimulation   Electrical Stimulation Location low bac    Electrical Stimulation Action IFC    Electrical Stimulation Parameters L 15 x 10 min L18 100% scan    Electrical Stimulation Goals Pain      Manual Therapy   Manual Therapy Soft tissue mobilization    Manual therapy comments MTPR alongbil lumbar paraspinals x 2 ea.   performed in L sidelying   Soft tissue mobilization dTM along bil paraspinals                    PT Short Term Goals - 08/18/20 1030      PT  SHORT TERM GOAL #1   Title pt to be IND with initial HEP    Time 3    Period Weeks    Status New    Target Date 09/08/20      PT SHORT TERM GOAL #2   Title pt to verbalize/ demo efficient posture and lifting mechancis to reduce and prevent low back/ SIJ pain    Time 3    Period Weeks    Status New    Target Date 09/08/20             PT Long Term Goals - 08/18/20 1031      PT LONG TERM GOAL #1   Title pt to maintain trunk ext, and increase flexion by >/= 10 degrees with </= 5/10 max pain for function mobility required for ADLs    Time 6    Period Weeks    Status New    Target Date 09/29/20      PT LONG TERM GOAL #2   Title pt to be able to sit, stand and walk for >/= 30 min with </= 5/10 max pain for in home amb and short community distances.    Time 6    Period Weeks    Status New    Target Date 09/29/20      PT LONG TERM GOAL #3   Title increase gross hip strength to >/= 4+/5 to promote hip stability and maximize posture and lifting mechanic efficency    Time 6    Period Weeks    Status New    Target Date 09/29/20      PT LONG TERM GOAL #4   Title increase FOTO score to </= 45% limited to demo improvement in function    Time 6    Period Weeks    Status New    Target Date 09/29/20      PT LONG TERM GOAL #5   Title pt to be IND with all HEP given and is able to maintain and progress current level of function IND    Time 6    Period Weeks    Status New    Target Date 09/29/20                 Plan - 09/10/20 0957    Clinical Impression Statement continued pain noted between 6-8/10 with no report of relief carrying over from session to sessoin.  worked STW along paraspinals to reduce muscle tension and promote relief while strengthenig hips to maximize stability working into fatigue. continued E-stim end of session with MHP to calm down pain. Discussed with pt that next visit is his 6th visit and we should see some progress and if no measureable and  functional progress is made then plan to discharge back to his MD for further assessment.    PT Treatment/Interventions ADLs/Self Care Home Management;Electrical Stimulation;Iontophoresis 4mg /ml Dexamethasone;Moist Heat;Traction;Ultrasound;Stair training;Gait training;Therapeutic activities;Therapeutic exercise;Balance training;Neuromuscular re-education;Manual techniques;Dry needling;Passive range of motion;Taping;Patient/family education    PT Next Visit Plan discussion with patient regarding prognosis,  gross hip / knee strengthening. R SIJ hamstring stretching, SLR and gross hip strengthening/ stability, STW for low back/ hamstrings. posture education.    PT Home Exercise Plan [id number] - hamstring stretch, SLR, LTR, sidelying hip abduction    Consulted and Agree with Plan of Care Patient           Patient will benefit from skilled therapeutic intervention in order to improve the following deficits and impairments:  Improper body mechanics,Increased muscle spasms,Decreased strength,Pain,Decreased endurance,Decreased activity tolerance,Postural dysfunction,Decreased range of motion  Visit Diagnosis: Muscle spasm of back  Abnormal posture  Chronic bilateral low back pain without sciatica  Other abnormalities of gait and mobility  Muscle weakness (generalized)     Problem List Patient Active Problem List   Diagnosis Date Noted  . S/P lumbar fusion 12/05/2019  . Radiculopathy 01/04/2018    01/06/2018 PT, DPT, LAT, ATC  09/10/20  10:12 AM      [facility] Out[facility] [address] [place], [place], [place] Phone: [phone]   Fax:  [phone]  Name: Juan Long MRN: Andria Rhein Date of Birth: 1976-01-15

## 2020-09-15 ENCOUNTER — Encounter: Payer: Self-pay | Admitting: Physical Therapy

## 2020-09-15 ENCOUNTER — Other Ambulatory Visit: Payer: Self-pay

## 2020-09-15 ENCOUNTER — Ambulatory Visit: Payer: No Typology Code available for payment source | Admitting: Physical Therapy

## 2020-09-15 DIAGNOSIS — R2689 Other abnormalities of gait and mobility: Secondary | ICD-10-CM

## 2020-09-15 DIAGNOSIS — M62830 Muscle spasm of back: Secondary | ICD-10-CM | POA: Diagnosis not present

## 2020-09-15 DIAGNOSIS — R293 Abnormal posture: Secondary | ICD-10-CM

## 2020-09-15 DIAGNOSIS — M545 Low back pain, unspecified: Secondary | ICD-10-CM

## 2020-09-15 DIAGNOSIS — G8929 Other chronic pain: Secondary | ICD-10-CM

## 2020-09-15 DIAGNOSIS — M6281 Muscle weakness (generalized): Secondary | ICD-10-CM

## 2020-09-15 NOTE — Therapy (Signed)
[place], [place], [address] Phone: [phone]   Fax:  [phone]  Physical Therapy Treatment / discharge  Patient Details  Name: Juan Long MRN: [id number] Date of Birth: 02-19-1976 Referring Provider (PT): Dawley, Theodoro Doing, DO    Encounter Date: 09/15/2020   PT End of Session - 09/15/20 0936    Visit Number 6    Number of Visits 15    Date for PT Re-Evaluation 10/06/20    Authorization Type [facility]    Authorization - Visit Number 6    Authorization - Number of Visits 15    Progress Note Due on Visit 10    PT Start Time 0933    PT Stop Time 1005    PT Time Calculation (min) 32 min    Activity Tolerance Patient limited by pain    Behavior During Therapy [facility] for tasks assessed/performed           Past Medical History:  Diagnosis Date  . Arthritis   . Depression   . Esophageal reflux   . Essential hypertension, benign    many years ago  . Obstructive sleep apnea (adult) (pediatric)   . OSA on CPAP   . Spondylisthesis     Past Surgical History:  Procedure Laterality Date  . ANTERIOR CERVICAL DECOMP/DISCECTOMY FUSION Right 01/04/2018   Procedure: ANTERIOR CERVICAL DECOMPRESSION FUSION CERVICAL FIVE-SIX, CERVICAL SIX-SEVEN WITH INSTRUMENTATION AND ALLOGRAFT;  Surgeon: Phylliss Bob, MD;  Location: [facility];  Service: Orthopedics;  Laterality: Right;  . BACK SURGERY    . CERVICAL SPINE SURGERY    . NASAL SEPTUM SURGERY    . TONSILLECTOMY    . uvula shaved    . VARICOCELE EXCISION      There were no vitals filed for this visit.   Subjective Assessment - 09/15/20 0937    Subjective " no changes since the last session, i am still having aroudn 6-7/10 pain."    Currently in Pain? Yes    Pain Score 7     Pain Location Back    Pain Orientation Right    Pain Descriptors / Indicators Aching;Sore    Pain Type Chronic pain    Pain Onset More than a month ago    Pain Frequency Intermittent               [facility] PT Assessment - 09/15/20 0001      Assessment   Medical Diagnosis Sacroiliitis, not elsewhere classified M46.1    Referring Provider (PT) Dawley, Theodoro Doing, DO       Observation/Other Assessments   Focus on Therapeutic Outcomes (FOTO)  62% limited      AROM   Lumbar Flexion 40      Strength   Right Hip Flexion 4/5    Right Hip ABduction 4-/5    Left Hip ABduction 4-/5                         [facility] Adult PT Treatment/Exercise - 09/15/20 0001      Moist Heat Therapy   Number Minutes Moist Heat 10 Minutes    Moist Heat Location Lumbar Spine   in supine     Electrical Stimulation   Electrical Stimulation Location low bac    Electrical Stimulation Action IFC    Electrical Stimulation Parameters L 20 x 10 min 100% scan    Electrical Stimulation Goals Pain  PT Education - 09/15/20 1011    Education Details Reviewed HEP and FOTO findings, discussed imporance of continued activity and strengthening with increased reps and sets.    Person(s) Educated Patient    Methods Explanation;Verbal cues    Comprehension Verbalized understanding;Verbal cues required            PT Short Term Goals - 09/15/20 0938      PT SHORT TERM GOAL #1   Title pt to be IND with initial HEP    Period Weeks    Status Achieved      PT SHORT TERM GOAL #2   Title pt to verbalize/ demo efficient posture and lifting mechancis to reduce and prevent low back/ SIJ pain    Period Weeks    Status Partially Met             PT Long Term Goals - 09/15/20 0939      PT LONG TERM GOAL #1   Title pt to maintain trunk ext, and increase flexion by >/= 10 degrees with </= 5/10 max pain for function mobility required for ADLs    Baseline increased by 2 degrees to 40 degrees    Period Weeks    Status Not Met      PT LONG TERM GOAL #2   Title pt to be able to sit, stand and walk for >/= 30 min with </= 5/10 max pain for in home amb and short community  distances.    Baseline able to walk 30 min but notes pain gets up to 9/10 or more    Period Weeks    Status Partially Met      PT LONG TERM GOAL #3   Title increase gross hip strength to >/= 4+/5 to promote hip stability and maximize posture and lifting mechanic efficency    Period Weeks    Status Not Met      PT LONG TERM GOAL #4   Title increase FOTO score to </= 45% limited to demo improvement in function    Period Weeks    Status Not Met      PT LONG TERM GOAL #5   Title pt to be IND with all HEP given and is able to maintain and progress current level of function IND                 Plan - 09/15/20 1008    Clinical Impression Statement Juan Long continues to report constant pain fluctuating between 6-7/10 located along the R and L low back/ SIJ, and reports limited improvement since starting physical therapy. He continues to demo limited trunk mobility and has made limited progress toward his goals as well as limited functional progress in the last 6 weeks with no carry-over. Based on limited improvement and continued high level of pain plan to refer pt back to his MD for further assessment.    PT Treatment/Interventions ADLs/Self Care Home Management;Electrical Stimulation;Iontophoresis 5m/ml Dexamethasone;Moist Heat;Traction;Ultrasound;Stair training;Gait training;Therapeutic activities;Therapeutic exercise;Balance training;Neuromuscular re-education;Manual techniques;Dry needling;Passive range of motion;Taping;Patient/family education           Patient will benefit from skilled therapeutic intervention in order to improve the following deficits and impairments:  Improper body mechanics,Increased muscle spasms,Decreased strength,Pain,Decreased endurance,Decreased activity tolerance,Postural dysfunction,Decreased range of motion  Visit Diagnosis: Muscle spasm of back  Abnormal posture  Chronic bilateral low back pain without sciatica  Other abnormalities of gait and  mobility  Muscle weakness (generalized)     Problem List Patient Active Problem  List   Diagnosis Date Noted  . S/P lumbar fusion 12/05/2019  . Radiculopathy 01/04/2018    Juan Long 09/15/2020, 10:15 AM  [facility] [address] [place], [place], [address] Phone: [phone]   Fax:  [phone]  Name: Juan Long MRN: [id number] Date of Birth: [birth year]     PHYSICAL THERAPY DISCHARGE SUMMARY  Visits from Start of Care: 6  Current functional level related to goals / functional outcomes: See goals, FOTO 62% limited   Remaining deficits: See assessment   Education / Equipment: HEP, theraband, posture, lifting mechanics.   Plan: Patient agrees to discharge.  Patient goals were not met. Patient is being discharged due to lack of progress.  ?????      Neri Vieyra PT, DPT, LAT, ATC  09/15/20  10:16 AM

## 2020-09-17 ENCOUNTER — Ambulatory Visit: Payer: No Typology Code available for payment source | Admitting: Physical Therapy

## 2020-09-25 ENCOUNTER — Other Ambulatory Visit: Payer: Self-pay | Admitting: Neurological Surgery

## 2020-09-26 ENCOUNTER — Other Ambulatory Visit: Payer: Self-pay | Admitting: Neurological Surgery

## 2020-10-17 ENCOUNTER — Ambulatory Visit: Admit: 2020-10-17 | Payer: No Typology Code available for payment source | Admitting: Neurological Surgery

## 2020-10-17 SURGERY — LUMBAR LAMINECTOMY/DECOMPRESSION MICRODISCECTOMY 1 LEVEL
Anesthesia: General | Laterality: Right

## 2020-10-20 ENCOUNTER — Other Ambulatory Visit: Payer: Self-pay | Admitting: Neurological Surgery

## 2020-10-30 NOTE — Pre-Procedure Instructions (Signed)
Juan Long  10/30/2020    Your procedure is scheduled on Tues., Feb. 15, 2022 from 11:00AM-12:30PM  Report to [facility] Entrance "A" at 9:00AM  Call this number if you have problems the morning of surgery:  [phone]   Remember:  Do not eat or drink after midnight on Feb. 14th    Take these medicines the morning of surgery with A SIP OF WATER: BusPIRone (BUSPAR) FLUoxetine (PROZAC) Carboxymethylcellulose Sodium (LUBRICANT EYE DROPS OP)  As of today, STOP taking all Aspirin (unless instructed by your doctor) and Other Aspirin containing products, Vitamins, Fish oils, and Herbal medications. Also stop all NSAIDS i.e. Advil, Ibuprofen, Motrin, Aleve, Anaprox, Naproxen, BC, Goody Powders, and all Supplements.   No Smoking of any kind, Tobacco/Vaping, or Alcohol products 24 hours prior to your procedure. If you use a Cpap at night, you may bring all equipment for your overnight stay.   Day of Surgery:  Do not wear jewelry.  Do not wear lotions, powders, colognes, or deodorant.  Do not shave 48 hours prior to surgery.  Men may shave face and neck.  Do not bring valuables to the hospital.  [facility] is not responsible for any belongings or valuables.  Contacts, dentures or bridgework may not be worn into surgery.    For patients admitted to the hospital, discharge time will be determined by your treatment team.  Patients discharged the day of surgery will not be allowed to drive home, and someone age 16 and over needs to stay with them for 24 hours.  Special instructions:  [facility]- Preparing For Surgery  Before surgery, you can play an important role. Because skin is not sterile, your skin needs to be as free of germs as possible. You can reduce the number of germs on your skin by washing with CHG (chlorahexidine gluconate) Soap before surgery.  CHG is an antiseptic cleaner which kills germs and bonds with the skin to continue killing germs even after  washing.    Oral Hygiene is also important to reduce your risk of infection.  Remember - BRUSH YOUR TEETH THE MORNING OF SURGERY WITH YOUR REGULAR TOOTHPASTE  Please do not use if you have an allergy to CHG or antibacterial soaps. If your skin becomes reddened/irritated stop using the CHG.  Do not shave (including legs and underarms) for at least 48 hours prior to first CHG shower. It is OK to shave your face.  Please follow these instructions carefully.   1. Shower the NIGHT BEFORE SURGERY and the MORNING OF SURGERY with CHG.   2. If you chose to wash your hair, wash your hair first as usual with your normal shampoo.  3. After you shampoo, rinse your hair and body thoroughly to remove the shampoo.  4. Use CHG as you would any other liquid soap. You can apply CHG directly to the skin and wash gently with a scrungie or a clean washcloth.   5. Apply the CHG Soap to your body ONLY FROM THE NECK DOWN.  Do not use on open wounds or open sores. Avoid contact with your eyes, ears, mouth and genitals (private parts). Wash Face and genitals (private parts)  with your normal soap.  6. Wash thoroughly, paying special attention to the area where your surgery will be performed.  7. Thoroughly rinse your body with warm water from the neck down.  8. DO NOT shower/wash with your normal soap after using and rinsing off the CHG Soap.  9. Pat yourself dry with a CLEAN TOWEL.  10. Wear CLEAN PAJAMAS to bed the night before surgery, wear comfortable clothes the morning of surgery  11. Place CLEAN SHEETS on your bed the night of your first shower and DO NOT SLEEP WITH PETS.  Reminders: Do not apply any deodorants/lotions.  Please wear clean clothes to the hospital/surgery center.   Remember to brush your teeth WITH YOUR REGULAR TOOTHPASTE.  Please read over the following fact sheets that you were given.

## 2020-10-31 ENCOUNTER — Other Ambulatory Visit: Payer: Self-pay

## 2020-10-31 ENCOUNTER — Other Ambulatory Visit (HOSPITAL_COMMUNITY)
Admission: RE | Admit: 2020-10-31 | Discharge: 2020-10-31 | Disposition: A | Discharge status: Home / Self Care | Payer: No Typology Code available for payment source | Source: Ambulatory Visit | Attending: Neurological Surgery | Admitting: Neurological Surgery

## 2020-10-31 ENCOUNTER — Encounter (HOSPITAL_COMMUNITY)
Admission: RE | Admit: 2020-10-31 | Discharge: 2020-10-31 | Disposition: A | Discharge status: Home / Self Care | Payer: No Typology Code available for payment source | Source: Ambulatory Visit | Attending: Internal Medicine | Admitting: Internal Medicine

## 2020-10-31 ENCOUNTER — Encounter (HOSPITAL_COMMUNITY): Payer: Self-pay

## 2020-10-31 DIAGNOSIS — Z01812 Encounter for preprocedural laboratory examination: Secondary | ICD-10-CM | POA: Insufficient documentation

## 2020-10-31 DIAGNOSIS — Z20822 Contact with and (suspected) exposure to covid-19: Secondary | ICD-10-CM | POA: Insufficient documentation

## 2020-10-31 LAB — CBC WITH DIFFERENTIAL/PLATELET
Abs Immature Granulocytes: 0.04 10*3/uL (ref 0.00–0.07)
Basophils Absolute: 0 10*3/uL (ref 0.0–0.1)
Basophils Relative: 1 %
Eosinophils Absolute: 0.1 10*3/uL (ref 0.0–0.5)
Eosinophils Relative: 2 %
HCT: 48.1 % (ref 39.0–52.0)
Hemoglobin: 15.6 g/dL (ref 13.0–17.0)
Immature Granulocytes: 1 %
Lymphocytes Relative: 28 %
Lymphs Abs: 1.8 10*3/uL (ref 0.7–4.0)
MCH: 28.5 pg (ref 26.0–34.0)
MCHC: 32.4 g/dL (ref 30.0–36.0)
MCV: 87.9 fL (ref 80.0–100.0)
Monocytes Absolute: 0.5 10*3/uL (ref 0.1–1.0)
Monocytes Relative: 7 %
Neutro Abs: 3.9 10*3/uL (ref 1.7–7.7)
Neutrophils Relative %: 61 %
Platelets: 356 10*3/uL (ref 150–400)
RBC: 5.47 MIL/uL (ref 4.22–5.81)
RDW: 13.9 % (ref 11.5–15.5)
WBC: 6.3 10*3/uL (ref 4.0–10.5)
nRBC: 0 % (ref 0.0–0.2)

## 2020-10-31 LAB — SURGICAL PCR SCREEN
MRSA, PCR: NEGATIVE
Staphylococcus aureus: NEGATIVE

## 2020-10-31 LAB — PROTIME-INR
INR: 1 (ref 0.8–1.2)
Prothrombin Time: 12.4 seconds (ref 11.4–15.2)

## 2020-10-31 LAB — BASIC METABOLIC PANEL
Anion gap: 11 (ref 5–15)
BUN: 13 mg/dL (ref 6–20)
CO2: 22 mmol/L (ref 22–32)
Calcium: 9.5 mg/dL (ref 8.9–10.3)
Chloride: 107 mmol/L (ref 98–111)
Creatinine, Ser: 0.87 mg/dL (ref 0.61–1.24)
GFR, Estimated: >60 mL/min (ref >60)
Glucose, Bld: 101 mg/dL — ABNORMAL HIGH (ref 70–99)
Potassium: 4.2 mmol/L (ref 3.5–5.1)
Sodium: 140 mmol/L (ref 135–145)

## 2020-10-31 LAB — TYPE AND SCREEN
ABO/RH(D): O POS
Antibody Screen: NEGATIVE

## 2020-10-31 LAB — SARS CORONAVIRUS 2 (TAT 6-24 HRS): SARS Coronavirus 2: NEGATIVE

## 2020-10-31 NOTE — Pre-Procedure Instructions (Signed)
Juan Long  10/31/2020    Your procedure is scheduled on Tues., Feb. 15, 2022 from 11:00AM-12:30PM  Report to [facility] Entrance "A" at 9:00AM  Call this number if you have problems the morning of surgery:  [phone]   Remember:  Do not eat or drink after midnight on Feb. 14th    Take these medicines the morning of surgery with A SIP OF WATER:  BusPIRone (BUSPAR)  FLUoxetine (PROZAC)  Carboxymethylcellulose Sodium (LUBRICANT EYE DROPS OP)  As of today, STOP taking all Aspirin (unless instructed by your doctor) and Other Aspirin containing products, Vitamins, Fish oils, and Herbal medications. Also stop all NSAIDS i.e. Advil, Ibuprofen, Motrin, Aleve, Anaprox, Naproxen, BC, Goody Powders, and all Supplements.   No Smoking of any kind, Tobacco/Vaping, or Alcohol products 24 hours prior to your procedure. If you use a Cpap at night, you may bring all equipment for your overnight stay.   Day of Surgery:  Do not wear jewelry.  Do not wear lotions, powders, colognes, or deodorant.  Men may shave face and neck.  Do not bring valuables to the hospital.  [facility] is not responsible for any belongings or valuables.  Contacts, dentures or bridgework may not be worn into surgery.    For patients admitted to the hospital, discharge time will be determined by your treatment team.  Patients discharged the day of surgery will not be allowed to drive home, and someone age 39 and over needs to stay with them for 24 hours.  Special instructions:  [facility]- Preparing For Surgery  Before surgery, you can play an important role. Because skin is not sterile, your skin needs to be as free of germs as possible. You can reduce the number of germs on your skin by washing with CHG (chlorahexidine gluconate) Soap before surgery.  CHG is an antiseptic cleaner which kills germs and bonds with the skin to continue killing germs even after washing.    Oral Hygiene is also  important to reduce your risk of infection.  Remember - BRUSH YOUR TEETH THE MORNING OF SURGERY WITH YOUR REGULAR TOOTHPASTE  Please do not use if you have an allergy to CHG or antibacterial soaps. If your skin becomes reddened/irritated stop using the CHG.  Do not shave (including legs and underarms) for at least 48 hours prior to first CHG shower. It is OK to shave your face.  Please follow these instructions carefully.   1. Shower the NIGHT BEFORE SURGERY and the MORNING OF SURGERY with CHG.   2. If you chose to wash your hair, wash your hair first as usual with your normal shampoo.  3. After you shampoo, rinse your hair and body thoroughly to remove the shampoo.  4. Use CHG as you would any other liquid soap. You can apply CHG directly to the skin and wash gently with a scrungie or a clean washcloth.   5. Apply the CHG Soap to your body ONLY FROM THE NECK DOWN.  Do not use on open wounds or open sores. Avoid contact with your eyes, ears, mouth and genitals (private parts). Wash Face and genitals (private parts)  with your normal soap.  6. Wash thoroughly, paying special attention to the area where your surgery will be performed.  7. Thoroughly rinse your body with warm water from the neck down.  8. DO NOT shower/wash with your normal soap after using and rinsing off the CHG Soap.  9. Pat yourself dry with a  CLEAN TOWEL.  10. Wear CLEAN PAJAMAS to bed the night before surgery, wear comfortable clothes the morning of surgery  11. Place CLEAN SHEETS on your bed the night of your first shower and DO NOT SLEEP WITH PETS.  Reminders: Do not apply any deodorants/lotions.  Please wear clean clothes to the hospital/surgery center.   Remember to brush your teeth WITH YOUR REGULAR TOOTHPASTE.  Please read over the following fact sheets that you were given.

## 2020-10-31 NOTE — Progress Notes (Signed)
PCP - [facility]) Cardiologist - denies  Chest x-ray - 3/4 (Care Everywhere) EKG - 3/4 (Care Everywhere)   SA - Yes, wears CPAP  COVID TEST- 2/11   Anesthesia review: yes, high BP (at PAT appointment) Fayrene Fearing aware of elevated BP.  Instructed patient to take BP at home, and to follow up with PCP if diastolic BP continues to run over 100.  Patient stated that last week his BP was normal when taken at the doctor's office.    Patient denies shortness of breath, fever, cough and chest pain at PAT appointment   All instructions explained to the patient, with a verbal understanding of the material. Patient agrees to go over the instructions while at home for a better understanding. Patient also instructed to self quarantine after being tested for COVID-19. The opportunity to ask questions was provided.

## 2020-11-04 ENCOUNTER — Other Ambulatory Visit: Payer: Self-pay

## 2020-11-04 ENCOUNTER — Ambulatory Visit (HOSPITAL_COMMUNITY): Payer: No Typology Code available for payment source | Admitting: Physician Assistant

## 2020-11-04 ENCOUNTER — Encounter (HOSPITAL_COMMUNITY)
Admission: RE | Disposition: A | Discharge status: Home / Self Care | Payer: Self-pay | Source: Home / Self Care | Attending: Neurological Surgery

## 2020-11-04 ENCOUNTER — Ambulatory Visit (HOSPITAL_COMMUNITY): Payer: No Typology Code available for payment source

## 2020-11-04 ENCOUNTER — Encounter (HOSPITAL_COMMUNITY): Payer: Self-pay | Admitting: Neurological Surgery

## 2020-11-04 ENCOUNTER — Ambulatory Visit (HOSPITAL_COMMUNITY): Payer: No Typology Code available for payment source | Admitting: Certified Registered Nurse Anesthetist

## 2020-11-04 ENCOUNTER — Observation Stay (HOSPITAL_COMMUNITY)
Admission: RE | Admit: 2020-11-04 | Discharge: 2020-11-05 | Disposition: A | Discharge status: Home / Self Care | Payer: No Typology Code available for payment source | Attending: Neurological Surgery | Admitting: Neurological Surgery

## 2020-11-04 DIAGNOSIS — I10 Essential (primary) hypertension: Secondary | ICD-10-CM | POA: Diagnosis not present

## 2020-11-04 DIAGNOSIS — Z87891 Personal history of nicotine dependence: Secondary | ICD-10-CM | POA: Diagnosis not present

## 2020-11-04 DIAGNOSIS — Z79899 Other long term (current) drug therapy: Secondary | ICD-10-CM | POA: Insufficient documentation

## 2020-11-04 DIAGNOSIS — M461 Sacroiliitis, not elsewhere classified: Principal | ICD-10-CM | POA: Diagnosis present

## 2020-11-04 DIAGNOSIS — Z7982 Long term (current) use of aspirin: Secondary | ICD-10-CM | POA: Diagnosis not present

## 2020-11-04 DIAGNOSIS — Z419 Encounter for procedure for purposes other than remedying health state, unspecified: Secondary | ICD-10-CM

## 2020-11-04 HISTORY — PX: LUMBAR LAMINECTOMY/DECOMPRESSION MICRODISCECTOMY: SHX5026

## 2020-11-04 SURGERY — LUMBAR LAMINECTOMY/DECOMPRESSION MICRODISCECTOMY 1 LEVEL
Anesthesia: General | Site: Pelvis | Laterality: Right

## 2020-11-04 MED ORDER — CHLORHEXIDINE GLUCONATE CLOTH 2 % EX PADS: 6.0000 | MEDICATED_PAD | Freq: Once | CUTANEOUS | Status: DC

## 2020-11-04 MED ORDER — HYDROCODONE-ACETAMINOPHEN 10-325 MG PO TABS
1.0000 | ORAL_TABLET | ORAL | Status: DC | PRN
  Administered 2020-11-04 – 2020-11-05 (×4): 1 via ORAL
  Filled 2020-11-04 (×4): qty 1

## 2020-11-04 MED ORDER — ONDANSETRON HCL 4 MG/2ML IJ SOLN: 4.0000 mg | Freq: Four times a day (QID) | INTRAMUSCULAR | Status: DC | PRN

## 2020-11-04 MED ORDER — SENNOSIDES-DOCUSATE SODIUM 8.6-50 MG PO TABS: 1.0000 | ORAL_TABLET | Freq: Every evening | ORAL | Status: DC | PRN

## 2020-11-04 MED ORDER — SUGAMMADEX SODIUM 200 MG/2ML IV SOLN
INTRAVENOUS | Status: DC | PRN
  Administered 2020-11-04: 100 mg via INTRAVENOUS
  Administered 2020-11-04 (×2): 50 mg via INTRAVENOUS

## 2020-11-04 MED ORDER — FENTANYL CITRATE (PF) 100 MCG/2ML IJ SOLN
INTRAMUSCULAR | Status: DC | PRN
  Administered 2020-11-04: 100 ug via INTRAVENOUS
  Administered 2020-11-04: 50 ug via INTRAVENOUS

## 2020-11-04 MED ORDER — DEXMEDETOMIDINE (PRECEDEX) IN NS 20 MCG/5ML (4 MCG/ML) IV SYRINGE
PREFILLED_SYRINGE | INTRAVENOUS | Status: AC
  Filled 2020-11-04: qty 5

## 2020-11-04 MED ORDER — OXYCODONE HCL 5 MG PO TABS
5.0000 mg | ORAL_TABLET | Freq: Once | ORAL | Status: DC | PRN
Start: 2020-11-04 — End: 2020-11-04

## 2020-11-04 MED ORDER — PHENOL 1.4 % MT LIQD: 1.0000 | OROMUCOSAL | Status: DC | PRN

## 2020-11-04 MED ORDER — CEFAZOLIN SODIUM-DEXTROSE 2-4 GM/100ML-% IV SOLN
2.0000 g | INTRAVENOUS | Status: AC
  Administered 2020-11-04: 2 g via INTRAVENOUS
  Filled 2020-11-04: qty 100

## 2020-11-04 MED ORDER — MENTHOL 3 MG MT LOZG: 1.0000 | LOZENGE | OROMUCOSAL | Status: DC | PRN

## 2020-11-04 MED ORDER — ACETAMINOPHEN 325 MG PO TABS
650.0000 mg | ORAL_TABLET | ORAL | Status: DC | PRN
Start: 2020-11-04 — End: 2020-11-05

## 2020-11-04 MED ORDER — SODIUM CHLORIDE 0.9% FLUSH: 3.0000 mL | Freq: Two times a day (BID) | INTRAVENOUS | Status: DC

## 2020-11-04 MED ORDER — LIDOCAINE 2% (20 MG/ML) 5 ML SYRINGE
INTRAMUSCULAR | Status: AC
  Filled 2020-11-04: qty 5

## 2020-11-04 MED ORDER — OXYCODONE HCL 5 MG/5ML PO SOLN: 5.0000 mg | Freq: Once | ORAL | Status: DC | PRN

## 2020-11-04 MED ORDER — HYDROMORPHONE HCL 1 MG/ML IJ SOLN: 0.5000 mg | INTRAMUSCULAR | Status: DC | PRN

## 2020-11-04 MED ORDER — ROCURONIUM BROMIDE 10 MG/ML (PF) SYRINGE
PREFILLED_SYRINGE | INTRAVENOUS | Status: DC | PRN
  Administered 2020-11-04: 70 mg via INTRAVENOUS

## 2020-11-04 MED ORDER — DEXAMETHASONE SODIUM PHOSPHATE 10 MG/ML IJ SOLN
INTRAMUSCULAR | Status: DC | PRN
  Administered 2020-11-04: 10 mg via INTRAVENOUS

## 2020-11-04 MED ORDER — HYDROMORPHONE HCL 1 MG/ML IJ SOLN
INTRAMUSCULAR | Status: AC
  Filled 2020-11-04: qty 1

## 2020-11-04 MED ORDER — DEXAMETHASONE SODIUM PHOSPHATE 10 MG/ML IJ SOLN
INTRAMUSCULAR | Status: AC
  Filled 2020-11-04: qty 1

## 2020-11-04 MED ORDER — FLUOXETINE HCL 20 MG PO CAPS
20.0000 mg | ORAL_CAPSULE | Freq: Every day | ORAL | Status: DC
  Administered 2020-11-04 – 2020-11-05 (×2): 20 mg via ORAL
  Filled 2020-11-04 (×2): qty 1

## 2020-11-04 MED ORDER — THROMBIN 5000 UNITS EX SOLR
CUTANEOUS | Status: AC
  Filled 2020-11-04: qty 5000

## 2020-11-04 MED ORDER — LIDOCAINE 2% (20 MG/ML) 5 ML SYRINGE
INTRAMUSCULAR | Status: DC | PRN
  Administered 2020-11-04: 100 mg via INTRAVENOUS

## 2020-11-04 MED ORDER — ACETAMINOPHEN 500 MG PO TABS
ORAL_TABLET | ORAL | Status: AC
  Filled 2020-11-04: qty 2

## 2020-11-04 MED ORDER — POLYVINYL ALCOHOL 1.4 % OP SOLN
1.0000 [drp] | Freq: Three times a day (TID) | OPHTHALMIC | Status: DC
  Administered 2020-11-04 – 2020-11-05 (×2): 1 [drp] via OPHTHALMIC
  Filled 2020-11-04: qty 15

## 2020-11-04 MED ORDER — PHENYLEPHRINE 40 MCG/ML (10ML) SYRINGE FOR IV PUSH (FOR BLOOD PRESSURE SUPPORT)
PREFILLED_SYRINGE | INTRAVENOUS | Status: AC
  Filled 2020-11-04: qty 10

## 2020-11-04 MED ORDER — ACETAMINOPHEN 500 MG PO TABS
1000.0000 mg | ORAL_TABLET | Freq: Once | ORAL | Status: AC
  Administered 2020-11-04: 1000 mg via ORAL

## 2020-11-04 MED ORDER — SODIUM CHLORIDE 0.9 % IV SOLN: INTRAVENOUS | Status: DC

## 2020-11-04 MED ORDER — FENTANYL CITRATE (PF) 250 MCG/5ML IJ SOLN
INTRAMUSCULAR | Status: AC
  Filled 2020-11-04: qty 5

## 2020-11-04 MED ORDER — ONDANSETRON HCL 4 MG/2ML IJ SOLN: 4.0000 mg | Freq: Once | INTRAMUSCULAR | Status: DC | PRN

## 2020-11-04 MED ORDER — BUPIVACAINE HCL (PF) 0.5 % IJ SOLN
INTRAMUSCULAR | Status: AC
  Filled 2020-11-04: qty 30

## 2020-11-04 MED ORDER — BUSPIRONE HCL 10 MG PO TABS
10.0000 mg | ORAL_TABLET | Freq: Two times a day (BID) | ORAL | Status: DC
  Administered 2020-11-04 – 2020-11-05 (×3): 10 mg via ORAL
  Filled 2020-11-04 (×3): qty 1

## 2020-11-04 MED ORDER — HYDROMORPHONE HCL 1 MG/ML IJ SOLN
0.2500 mg | INTRAMUSCULAR | Status: DC | PRN
  Administered 2020-11-04: 0.5 mg via INTRAVENOUS

## 2020-11-04 MED ORDER — ALUM & MAG HYDROXIDE-SIMETH 200-200-20 MG/5ML PO SUSP: 30.0000 mL | Freq: Four times a day (QID) | ORAL | Status: DC | PRN

## 2020-11-04 MED ORDER — BUPIVACAINE HCL 0.5 % IJ SOLN
INTRAMUSCULAR | Status: DC | PRN
  Administered 2020-11-04: 20 mL

## 2020-11-04 MED ORDER — THROMBIN 5000 UNITS EX SOLR: OROMUCOSAL | Status: DC | PRN

## 2020-11-04 MED ORDER — MIDAZOLAM HCL 2 MG/2ML IJ SOLN
INTRAMUSCULAR | Status: AC
  Filled 2020-11-04: qty 2

## 2020-11-04 MED ORDER — ACETAMINOPHEN 650 MG RE SUPP: 650.0000 mg | RECTAL | Status: DC | PRN

## 2020-11-04 MED ORDER — PHENYLEPHRINE HCL (PRESSORS) 10 MG/ML IV SOLN
INTRAVENOUS | Status: DC | PRN
  Administered 2020-11-04: 80 ug via INTRAVENOUS
  Administered 2020-11-04: 120 ug via INTRAVENOUS
  Administered 2020-11-04: 80 ug via INTRAVENOUS
  Administered 2020-11-04: 120 ug via INTRAVENOUS

## 2020-11-04 MED ORDER — PHENYLEPHRINE HCL-NACL 10-0.9 MG/250ML-% IV SOLN
INTRAVENOUS | Status: DC | PRN
  Administered 2020-11-04: 25 ug/min via INTRAVENOUS

## 2020-11-04 MED ORDER — MICROFIBRILLAR COLL HEMOSTAT EX POWD
CUTANEOUS | Status: AC
  Filled 2020-11-04: qty 5

## 2020-11-04 MED ORDER — MIDAZOLAM HCL 5 MG/5ML IJ SOLN
INTRAMUSCULAR | Status: DC | PRN
  Administered 2020-11-04: 2 mg via INTRAVENOUS

## 2020-11-04 MED ORDER — EPHEDRINE SULFATE-NACL 50-0.9 MG/10ML-% IV SOSY
PREFILLED_SYRINGE | INTRAVENOUS | Status: DC | PRN
  Administered 2020-11-04 (×2): 10 mg via INTRAVENOUS

## 2020-11-04 MED ORDER — CHLORHEXIDINE GLUCONATE 0.12 % MT SOLN
OROMUCOSAL | Status: AC
  Administered 2020-11-04: 15 mL via OROMUCOSAL
  Filled 2020-11-04: qty 15

## 2020-11-04 MED ORDER — CELECOXIB 200 MG PO CAPS
ORAL_CAPSULE | ORAL | Status: AC
  Administered 2020-11-04: 200 mg via ORAL
  Filled 2020-11-04: qty 2

## 2020-11-04 MED ORDER — DEXMEDETOMIDINE (PRECEDEX) IN NS 20 MCG/5ML (4 MCG/ML) IV SYRINGE
PREFILLED_SYRINGE | INTRAVENOUS | Status: DC | PRN
  Administered 2020-11-04: 12 ug via INTRAVENOUS

## 2020-11-04 MED ORDER — FLUOXETINE HCL 20 MG PO TABS
20.0000 mg | ORAL_TABLET | Freq: Every day | ORAL | Status: DC
  Filled 2020-11-04: qty 1

## 2020-11-04 MED ORDER — BUPIVACAINE LIPOSOME 1.3 % IJ SUSP
20.0000 mL | Freq: Once | INTRAMUSCULAR | Status: AC
  Administered 2020-11-04: 20 mL
  Filled 2020-11-04: qty 20

## 2020-11-04 MED ORDER — ROCURONIUM BROMIDE 10 MG/ML (PF) SYRINGE
PREFILLED_SYRINGE | INTRAVENOUS | Status: AC
  Filled 2020-11-04: qty 10

## 2020-11-04 MED ORDER — SODIUM CHLORIDE 0.9% FLUSH: 3.0000 mL | INTRAVENOUS | Status: DC | PRN

## 2020-11-04 MED ORDER — ONDANSETRON HCL 4 MG/2ML IJ SOLN
INTRAMUSCULAR | Status: DC | PRN
  Administered 2020-11-04: 4 mg via INTRAVENOUS

## 2020-11-04 MED ORDER — CYCLOBENZAPRINE HCL 10 MG PO TABS
10.0000 mg | ORAL_TABLET | Freq: Three times a day (TID) | ORAL | Status: DC | PRN
  Administered 2020-11-04 – 2020-11-05 (×3): 10 mg via ORAL
  Filled 2020-11-04 (×3): qty 1

## 2020-11-04 MED ORDER — PROPOFOL 10 MG/ML IV BOLUS
INTRAVENOUS | Status: AC
  Filled 2020-11-04: qty 40

## 2020-11-04 MED ORDER — CHLORHEXIDINE GLUCONATE 0.12 % MT SOLN: 15.0000 mL | Freq: Once | OROMUCOSAL | Status: AC

## 2020-11-04 MED ORDER — METHYLPREDNISOLONE ACETATE 80 MG/ML IJ SUSP
INTRAMUSCULAR | Status: AC
  Filled 2020-11-04: qty 1

## 2020-11-04 MED ORDER — LACTATED RINGERS IV SOLN: INTRAVENOUS | Status: DC

## 2020-11-04 MED ORDER — SODIUM CHLORIDE 0.9 % IV SOLN
250.0000 mL | INTRAVENOUS | Status: DC
  Administered 2020-11-04: 250 mL via INTRAVENOUS

## 2020-11-04 MED ORDER — ORAL CARE MOUTH RINSE: 15.0000 mL | Freq: Once | OROMUCOSAL | Status: AC

## 2020-11-04 MED ORDER — CELECOXIB 200 MG PO CAPS: 200.0000 mg | ORAL_CAPSULE | Freq: Once | ORAL | Status: AC

## 2020-11-04 MED ORDER — LIDOCAINE-EPINEPHRINE 1 %-1:100000 IJ SOLN
INTRAMUSCULAR | Status: AC
  Filled 2020-11-04: qty 1

## 2020-11-04 MED ORDER — ONDANSETRON HCL 4 MG/2ML IJ SOLN
INTRAMUSCULAR | Status: AC
  Filled 2020-11-04: qty 2

## 2020-11-04 MED ORDER — CEFAZOLIN SODIUM-DEXTROSE 2-4 GM/100ML-% IV SOLN
2.0000 g | Freq: Three times a day (TID) | INTRAVENOUS | Status: AC
  Administered 2020-11-04 – 2020-11-05 (×2): 2 g via INTRAVENOUS
  Filled 2020-11-04 (×2): qty 100

## 2020-11-04 MED ORDER — PROPOFOL 10 MG/ML IV BOLUS
INTRAVENOUS | Status: DC | PRN
  Administered 2020-11-04: 200 mg via INTRAVENOUS
  Administered 2020-11-04: 100 mg via INTRAVENOUS

## 2020-11-04 MED ORDER — 0.9 % SODIUM CHLORIDE (POUR BTL) OPTIME
TOPICAL | Status: DC | PRN
  Administered 2020-11-04: 1000 mL

## 2020-11-04 MED ORDER — ONDANSETRON HCL 4 MG PO TABS: 4.0000 mg | ORAL_TABLET | Freq: Four times a day (QID) | ORAL | Status: DC | PRN

## 2020-11-04 SURGICAL SUPPLY — 53 items
CNTNR URN SCR LID CUP LEK RST (MISCELLANEOUS) ×1 IMPLANT
CONT SPEC 4OZ STRL OR WHT (MISCELLANEOUS) ×2
COVER MAYO STAND STRL (DRAPES) ×2 IMPLANT
DECANTER SPIKE VIAL GLASS SM (MISCELLANEOUS) ×2 IMPLANT
DERMABOND ADVANCED (GAUZE/BANDAGES/DRESSINGS) ×1
DERMABOND ADVANCED .7 DNX12 (GAUZE/BANDAGES/DRESSINGS) ×1 IMPLANT
DRAPE C-ARM 42X72 X-RAY (DRAPES) ×2 IMPLANT
DRAPE C-ARMOR (DRAPES) ×2 IMPLANT
DRAPE LAPAROTOMY 100X72X124 (DRAPES) ×2 IMPLANT
DRAPE MICROSCOPE LEICA (MISCELLANEOUS) ×2 IMPLANT
DRAPE SURG 17X23 STRL (DRAPES) ×2 IMPLANT
DRSG OPSITE POSTOP 4X6 (GAUZE/BANDAGES/DRESSINGS) ×2 IMPLANT
DURAPREP 26ML APPLICATOR (WOUND CARE) ×2 IMPLANT
ELECT BLADE INSULATED 4IN (ELECTROSURGICAL) ×2
ELECT REM PT RETURN 9FT ADLT (ELECTROSURGICAL) ×2
ELECTRODE BLADE INSULATED 4IN (ELECTROSURGICAL) ×1 IMPLANT
ELECTRODE REM PT RTRN 9FT ADLT (ELECTROSURGICAL) ×1 IMPLANT
GAUZE 4X4 16PLY RFD (DISPOSABLE) IMPLANT
GAUZE SPONGE 4X4 12PLY STRL (GAUZE/BANDAGES/DRESSINGS) ×2 IMPLANT
GLOVE ECLIPSE 8.0 STRL XLNG CF (GLOVE) ×4 IMPLANT
GLOVE ECLIPSE 8.5 STRL (GLOVE) ×6 IMPLANT
GLOVE INDICATOR 8.0 STRL GRN (GLOVE) ×4 IMPLANT
GLOVE INDICATOR 8.5 STRL (GLOVE) ×4 IMPLANT
GLOVE SRG 8 PF TXTR STRL LF DI (GLOVE) ×2 IMPLANT
GLOVE SURG UNDER POLY LF SZ8 (GLOVE) ×4
GOWN STRL REUS W/ TWL LRG LVL3 (GOWN DISPOSABLE) ×1 IMPLANT
GOWN STRL REUS W/ TWL XL LVL3 (GOWN DISPOSABLE) ×1 IMPLANT
GOWN STRL REUS W/TWL 2XL LVL3 (GOWN DISPOSABLE) ×2 IMPLANT
GOWN STRL REUS W/TWL LRG LVL3 (GOWN DISPOSABLE) ×2
GOWN STRL REUS W/TWL XL LVL3 (GOWN DISPOSABLE) ×2
GUIDE PIN IFUSE DISP 3.2 (PIN) ×6
HEMOSTAT POWDER KIT SURGIFOAM (HEMOSTASIS) ×2 IMPLANT
IMPL IFUSE 7.0X45 (Rod) ×1 IMPLANT
IMPLANT IFUSE 7.0MMX40MM (Rod) ×4 IMPLANT
IMPLANT IFUSE 7.0X45 (Rod) ×2 IMPLANT
KIT BASIN OR (CUSTOM PROCEDURE TRAY) ×2 IMPLANT
KIT POSITION SURG JACKSON T1 (MISCELLANEOUS) ×2 IMPLANT
KIT TURNOVER KIT B (KITS) ×2 IMPLANT
MARKER SKIN DUAL TIP RULER LAB (MISCELLANEOUS) ×2 IMPLANT
NEEDLE HYPO 21X1.5 SAFETY (NEEDLE) ×2 IMPLANT
NEEDLE HYPO 25X1 1.5 SAFETY (NEEDLE) ×2 IMPLANT
NS IRRIG 1000ML POUR BTL (IV SOLUTION) ×2 IMPLANT
PACK LAMINECTOMY NEURO (CUSTOM PROCEDURE TRAY) ×2 IMPLANT
PIN BLUNT IFUSE DISP 3.2 (PIN) ×2 IMPLANT
PIN EXCHANGE IFUSE DISP 3.2 (PIN) ×2 IMPLANT
PIN GUIDE IFUSE DISP 3.2 (PIN) ×3 IMPLANT
PUTTY BONE 100 VESUVIUS 2.5CC (Putty) ×2 IMPLANT
SUT VIC AB 2-0 CP2 18 (SUTURE) ×2 IMPLANT
SYS SPNL FX3ANG 40X7X (Rod) ×2 IMPLANT
SYSTEM SPNL FX3ANG 40X7X (Rod) ×2 IMPLANT
TOWEL GREEN STERILE (TOWEL DISPOSABLE) ×2 IMPLANT
TOWEL GREEN STERILE FF (TOWEL DISPOSABLE) ×2 IMPLANT
WATER STERILE IRR 1000ML POUR (IV SOLUTION) ×2 IMPLANT

## 2020-11-04 NOTE — Anesthesia Preprocedure Evaluation (Addendum)
Anesthesia Evaluation  Patient identified by MRN, date of birth, ID band Patient awake    Reviewed: Allergy & Precautions, NPO status , Patient's Chart, lab work & pertinent test results  Airway Mallampati: II  TM Distance: >3 FB Neck ROM: Full    Dental  (+) Teeth Intact   Pulmonary sleep apnea and Continuous Positive Airway Pressure Ventilation , former smoker,    Pulmonary exam normal        Cardiovascular hypertension, Pt. on medications  Rhythm:Regular Rate:Normal     Neuro/Psych Depression negative neurological ROS     GI/Hepatic Neg liver ROS, GERD  Controlled,  Endo/Other  negative endocrine ROS  Renal/GU negative Renal ROS  negative genitourinary   Musculoskeletal  (+) Arthritis , Osteoarthritis,  ACDF 2019   Abdominal (+)  Abdomen: soft. Bowel sounds: normal.  Peds  Hematology negative hematology ROS (+)   Anesthesia Other Findings   Reproductive/Obstetrics                            Anesthesia Physical Anesthesia Plan  ASA: II  Anesthesia Plan: General   Post-op Pain Management:    Induction: Intravenous  PONV Risk Score and Plan: 2 and Ondansetron, Dexamethasone, Midazolam and Treatment may vary due to age or medical condition  Airway Management Planned: Mask and Oral ETT  Additional Equipment: None  Intra-op Plan:   Post-operative Plan: Extubation in OR  Informed Consent: I have reviewed the patients History and Physical, chart, labs and discussed the procedure including the risks, benefits and alternatives for the proposed anesthesia with the patient or authorized representative who has indicated his/her understanding and acceptance.     Dental advisory given  Plan Discussed with: CRNA  Anesthesia Plan Comments: (Lab Results      Component                Value               Date                      WBC                      6.3                 10/31/2020                 HGB                      15.6                10/31/2020                HCT                      48.1                10/31/2020                MCV                      87.9                10/31/2020                PLT  356                 10/31/2020           Lab Results      Component                Value               Date                      NA                       140                 10/31/2020                K                        4.2                 10/31/2020                CO2                      22                  10/31/2020                GLUCOSE                  101 (H)             10/31/2020                BUN                      13                  10/31/2020                CREATININE               0.87                10/31/2020                CALCIUM                  9.5                 10/31/2020                GFRNONAA                 >60                 10/31/2020                GFRAA                    >60                 11/22/2019          )        Anesthesia Quick Evaluation

## 2020-11-04 NOTE — Transfer of Care (Signed)
Immediate Anesthesia Transfer of Care Note  Patient: Juan Long  Procedure(s) Performed: SI Joint Fusion (Right Pelvis)  Patient Location: PACU  Anesthesia Type:General  Level of Consciousness: drowsy and patient cooperative  Airway & Oxygen Therapy: Patient Spontanous Breathing and Patient connected to face mask oxygen  Post-op Assessment: Report given to RN and Post -op Vital signs reviewed and stable  Post vital signs: Reviewed and stable  Last Vitals:  Vitals Value Taken Time  BP 131/88 11/04/20 1225  Temp    Pulse 94 11/04/20 1228  Resp 15 11/04/20 1228  SpO2 95 % 11/04/20 1228  Vitals shown include unvalidated device data.  Last Pain:  Vitals:   11/04/20 0927  TempSrc:   PainSc: 8       Patients Stated Pain Goal: 2 (11/04/20 9373)  Complications: No complications documented.

## 2020-11-04 NOTE — Op Note (Signed)
Providing Compassionate, Quality Care - Together   Date of service: 11/04/2020  PREOP DIAGNOSIS:  Right sacroiliitis  POSTOP DIAGNOSIS: Same  PROCEDURE: Right minimally invasive sacroiliac arthrodesis with iFuse implants Use of allograft, vesuvius 2.5 cc Intraoperative use of fluoroscopy  SURGEON: Dr. Kendell Bane C. Anjeanette Petzold, DO  ASSISTANT: Dr. Maeola Harman, MD  ANESTHESIA: General Endotracheal  EBL: 50 cc  SPECIMENS: None  DRAINS: None  COMPLICATIONS: None  CONDITION: Hemodynamically stable  HISTORY: KAYCE BETTY is a 45 y.o. male that previously had an L4-S1 fusion and did well. He began having right greater than left SI pain that failed conservative measures which included steroid injections, physical therapy and medications. He continued to have right-sided SI pain, positive diagnostic blocks, positive provocative testing therefore we discussed SI fusion. We went over all risks, benefits and alternatives as well as expected outcomes and he agreed to proceed with surgical intervention.  PROCEDURE IN DETAIL: The patient was brought to the operating room. After induction of general anesthesia, the patient was positioned prone on the Thompsons table. All pressure points were meticulously padded. Lateral fluoroscopy was used to mark the skin incision at the junction of the sacral ala and posterior sacral line. This was prepped and draped in the usual sterile fashion.  Using a 10 blade, the skin and soft tissue was incised sharply approximately 3 cm along the posterior sacral line. Under lateral fluoroscopy, 3 pins were placed in the ileum approaching the sacrum using the manufactures 15 mm fixed guide. Inlet view was obtained and all pins were noted to be within the ileum approaching the mid sacral body appropriately. Outlet view was obtained and the 3 pins were tapped just aimed superior to the S1 foramen for the superior pin, directed towards the S1 foramen for the middle pin and  inferior to the S1 foramen for the inferior pin. All 3 we tapped across the SI joint appropriately. Soft tissue dilator was used to clear the pain free of fascia. Soft tissue dilator was placed over the superior pin, implant size was measured to be 45 mm, then the broach was used to cross the SI joint on the superior pin. The broach was removed and the pin was remained in the planned trajectory and confirmed with fluoroscopy. The selected implant was 45 mm which was covered with allograft and then placed under live fluoroscopy over the pin. The pin and soft tissue dilator were removed. The middle pin was dilated with a soft tissue dilator, implant size was measured to be 40 mm. The SI joint along this pin was broached. Marney Doctor was removed and the 40 mm implant was covered in allograft and placed under live fluoroscopy. The inferior pin was then dilated with the soft tissue dilator. Implant size was measured to be 37mm. The joint was broached and the broach was removed. The implant was then covered with allograft and placed under fluoroscopy. All pins were removed. Outlet view fluoroscopy was obtained and all implants were noted to be in appropriate placement without violation of any neuroforamina. Inlet view fluoroscopy was obtained and there was no anterior posterior breach noted. Lateral fluoroscopy was obtained and all implants were in appropriate placement. Hemostasis was achieved with Bovie cautery. The soft tissue and muscle was injected with local anesthetic.  At the end of the case all sponge, needle, and instrument counts were correct. The patient was then transferred to the stretcher, extubated, and taken to the post-anesthesia care unit in stable hemodynamic condition.

## 2020-11-04 NOTE — Anesthesia Postprocedure Evaluation (Signed)
Anesthesia Post Note  Patient: Juan Long  Procedure(s) Performed: SI Joint Fusion (Right Pelvis)     Patient location during evaluation: PACU Anesthesia Type: General Level of consciousness: awake and alert Pain management: pain level controlled Vital Signs Assessment: post-procedure vital signs reviewed and stable Respiratory status: spontaneous breathing, nonlabored ventilation, respiratory function stable and patient connected to nasal cannula oxygen Cardiovascular status: blood pressure returned to baseline and stable Postop Assessment: no apparent nausea or vomiting Anesthetic complications: no   No complications documented.  Last Vitals:  Vitals:   11/04/20 1327 11/04/20 1544  BP: 136/85 (!) 109/94  Pulse: 71 83  Resp: 20 17  Temp: 36.6 C 36.4 C  SpO2: 98% 96%    Last Pain:  Vitals:   11/04/20 1327  TempSrc: Oral  PainSc:                  Nelle Don Deonta Bomberger

## 2020-11-04 NOTE — Progress Notes (Addendum)
   Providing Compassionate, Quality Care - Together  NEUROSURGERY PROGRESS NOTE   S: Patient seen and examined in recovery room.  Denies any leg pain or numbness whatsoever.  O: EXAM:  BP 133/84 (BP Location: Right Arm)   Pulse 83   Temp 97.9 F (36.6 C)   Resp 14   Ht 5\' 11"  (1.803 m)   Wt 115.7 kg   SpO2 97%   BMI 35.57 kg/m   Awake, alert Speech fluent, appropriate  PERRLA CNs grossly intact  5/5 BUE/BLE  Incision clean dry and intact  ASSESSMENT:  45 y.o. male with  1.  Right sacroiliitis  -Status post right SI fusion on 11/04/2020  PLAN: -PT/OT eval -Pain control -will place in obs for overnight admission    Thank you for allowing me to participate in this patient's care.  Please do not hesitate to call with questions or concerns.   11/06/2020, DO Neurosurgeon [facility] Cell: [phone]

## 2020-11-04 NOTE — H&P (Signed)
Providing Compassionate, Quality Care - Together  NEUROSURGERY HISTORY & PHYSICAL   Juan Long is an 45 y.o. male.   Chief Complaint: Right sacroiliitis HPI: This is a 45 year old male with a history of an L4-S1 fusion by Dr. Yetta Barre that began to have right and left, right greater than left SI joint pain.  He underwent physical therapy, medication treatment which did not significantly help his pain.  He then underwent multiple diagnostic and steroid injections which improved greater than 85% of his pain in the right SI joint.  He had positive diagnostic test, Pearlean Brownie.  He presents for right SI joint fusion today.  All risks, benefits and expected outcomes were discussed and agreed upon by the patient.   Past Medical History:  Diagnosis Date  . Arthritis   . Depression   . Esophageal reflux   . Essential hypertension, benign    many years ago  . Obstructive sleep apnea (adult) (pediatric)   . OSA on CPAP   . Spondylisthesis     Past Surgical History:  Procedure Laterality Date  . ANTERIOR CERVICAL DECOMP/DISCECTOMY FUSION Right 01/04/2018   Procedure: ANTERIOR CERVICAL DECOMPRESSION FUSION CERVICAL FIVE-SIX, CERVICAL SIX-SEVEN WITH INSTRUMENTATION AND ALLOGRAFT;  Surgeon: Estill Bamberg, MD;  Location: [facility] OR;  Service: Orthopedics;  Laterality: Right;  . BACK SURGERY    . CERVICAL SPINE SURGERY    . NASAL SEPTUM SURGERY    . SHOULDER ARTHROSCOPY Right 2019  . TONSILLECTOMY    . uvula shaved    . VARICOCELE EXCISION      Family History  Problem Relation Age of Onset  . Heart attack Father    Social History:  reports that he has quit smoking. His smoking use included cigarettes. He has quit using smokeless tobacco. He reports current alcohol use. He reports that he does not use drugs.  Allergies: No Known Allergies  Medications Prior to Admission  Medication Sig Dispense Refill  . aspirin EC 81 MG tablet Take 81 mg by mouth daily.    . busPIRone (BUSPAR) 10 MG  tablet Take 10 mg by mouth 2 (two) times daily.    . Carboxymethylcellulose Sodium (LUBRICANT EYE DROPS OP) Place 1 drop into both eyes 3 (three) times daily.    . Cholecalciferol (VITAMIN D3) 50 MCG (2000 UT) TABS Take 2,000 Units by mouth daily.    . cyclobenzaprine (FLEXERIL) 10 MG tablet Take 10 mg by mouth at bedtime as needed for muscle spasms.    Marland Kitchen FLUoxetine (PROZAC) 20 MG tablet Take 20 mg by mouth 3 (three) times daily.    . naproxen (NAPROSYN) 500 MG tablet Take 500 mg by mouth 2 (two) times daily as needed (pain.).    Marland Kitchen vitamin E 180 MG (400 UNITS) capsule Take 400 Units by mouth daily.      No results found for this or any previous visit (from the past 48 hour(s)). No results found.  ROS 14 point review of systems was obtained which all pertinent positives and negatives are listed HPI above  Blood pressure (!) 142/93, pulse 77, temperature 97.9 F (36.6 C), temperature source Oral, resp. rate 20, height 5\' 11"  (1.803 m), weight 115.7 kg, SpO2 95 %. Physical Exam  AOx3 PERRLA No acute distress EOMI Face symmetric Bilateral upper/lower extremity 5/5 Right SI joint tenderness Right positive   Assessment/Plan 45 year old male with  1.  Right sacroiliitis  -OR today for right SI fusion -Been off aspirin for 1 month  -All risks,  benefits and expected outcomes were discussed with the patient agreed upon.  These include but are not limited to hardware malfunction, infection, bleeding, temporary or permanent weakness or pain, need for more surgery, hematoma, heart attack, stroke, death.   Thank you for allowing me to participate in this patient's care.  Please do not hesitate to call with questions or concerns.   Monia Pouch, DO Neurosurgeon [facility] Cell: [phone]

## 2020-11-04 NOTE — Anesthesia Procedure Notes (Signed)
Procedure Name: Intubation Date/Time: 11/04/2020 10:44 AM Performed by: Modena Morrow, CRNA Pre-anesthesia Checklist: Patient identified, Emergency Drugs available, Suction available and Patient being monitored Patient Re-evaluated:Patient Re-evaluated prior to induction Oxygen Delivery Method: Circle system utilized Preoxygenation: Pre-oxygenation with 100% oxygen Induction Type: IV induction Ventilation: Oral airway inserted - appropriate to patient size and Mask ventilation without difficulty Grade View: Grade II Tube type: Oral Tube size: 7.5 mm Number of attempts: 2 Airway Equipment and Method: Stylet and Oral airway Placement Confirmation: ETT inserted through vocal cords under direct vision,  positive ETCO2 and breath sounds checked- equal and bilateral Secured at: 23 cm Tube secured with: Tape Dental Injury: Teeth and Oropharynx as per pre-operative assessment  Comments: Atraumatic inubation by Dianna Rossetti, SRNA, 2 attempts.

## 2020-11-05 ENCOUNTER — Encounter (HOSPITAL_COMMUNITY): Payer: Self-pay | Admitting: Neurological Surgery

## 2020-11-05 DIAGNOSIS — M461 Sacroiliitis, not elsewhere classified: Secondary | ICD-10-CM | POA: Diagnosis not present

## 2020-11-05 MED ORDER — HYDROCODONE-ACETAMINOPHEN 10-325 MG PO TABS
1.0000 | ORAL_TABLET | Freq: Four times a day (QID) | ORAL | 0 refills | Status: DC | PRN
Start: 2020-11-05 — End: 2021-07-10

## 2020-11-05 MED ORDER — ASPIRIN EC 81 MG PO TBEC: 81.0000 mg | DELAYED_RELEASE_TABLET | Freq: Every day | ORAL | 11 refills | Status: DC

## 2020-11-05 MED ORDER — TRAMADOL HCL 50 MG PO TABS
50.0000 mg | ORAL_TABLET | Freq: Four times a day (QID) | ORAL | 0 refills | Status: DC | PRN
Start: 2020-11-05 — End: 2021-08-20

## 2020-11-05 MED ORDER — CYCLOBENZAPRINE HCL 10 MG PO TABS: 10.0000 mg | ORAL_TABLET | Freq: Three times a day (TID) | ORAL | 1 refills | Status: DC | PRN

## 2020-11-05 NOTE — Evaluation (Signed)
Occupational Therapy Evaluation Patient Details Name: Juan Long MRN: [id number] DOB: 03/07/1976 Today's Date: 11/05/2020    History of Present Illness Pt is a 45 y/o male who presents s/p SI joint fusion on 11/04/2020. PMH significant for HTN, prior cervical surgery and back surgery.   Clinical Impression   Patient admitted for the above diagnosis and procedure.  He is at or near baseline for all ADL and mobility.  He is preparing to go home and verbalizes a solid understanding of all precautions.  No further acute OT needed, and no post acute OT needs.      Follow Up Recommendations  No OT follow up    Equipment Recommendations  None recommended by OT    Recommendations for Other Services       Precautions / Restrictions Precautions Precautions: Back Precaution Booklet Issued: Yes (comment) Precaution Comments: Reviewed handout and pt was cued for precautions during functional mobility. Restrictions Weight Bearing Restrictions: No      Mobility Bed Mobility Overal bed mobility: Independent             General bed mobility comments: Pt demonstrated log roll technique with HOB flat to simulate home environment. Pt with min use of rails but feel he would have been able to complete without railings.    Transfers Overall transfer level: Independent Equipment used: None             General transfer comment: No assist to power-up to full stand. Pt demonstrated good hand placement on seated surface for safety.    Balance Overall balance assessment: No apparent balance deficits (not formally assessed)                                         ADL either performed or assessed with clinical judgement   ADL Overall ADL's : At baseline                                             Vision Baseline Vision/History: No visual deficits Patient Visual Report: No change from baseline       Perception     Praxis       Pertinent Vitals/Pain Pain Assessment: No/denies pain Faces Pain Scale: Hurts a little bit Pain Location: Incision site Pain Descriptors / Indicators: Operative site guarding Pain Intervention(s): Monitored during session     Hand Dominance Right   Extremity/Trunk Assessment Upper Extremity Assessment Upper Extremity Assessment: Overall WFL for tasks assessed   Lower Extremity Assessment Lower Extremity Assessment: Defer to PT evaluation   Cervical / Trunk Assessment Cervical / Trunk Assessment: Other exceptions Cervical / Trunk Exceptions: s/p surgery   Communication Communication Communication: No difficulties   Cognition Arousal/Alertness: Awake/alert Behavior During Therapy: WFL for tasks assessed/performed Overall Cognitive Status: Within Functional Limits for tasks assessed                                                      Home Living Family/patient expects to be discharged to:: Private residence Living Arrangements: Spouse/significant other Available Help at Discharge: Family;Available 24 hours/day Type of Home: House Home Access: Stairs  to enter Entrance Stairs-Number of Steps: 1   Home Layout: One level     Bathroom Shower/Tub: Producer, television/film/video: Standard     Home Equipment: Cane - single point          Prior Functioning/Environment Level of Independence: Independent        Comments: Retired Designer, fashion/clothing. On disability        OT Problem List: Decreased activity tolerance      OT Treatment/Interventions:      OT Goals(Current goals can be found in the care plan section) Acute Rehab OT Goals Patient Stated Goal: ready to go home OT Goal Formulation: With patient Time For Goal Achievement: 11/05/20 Potential to Achieve Goals: Good  OT Frequency:     Barriers to D/C:  none          Co-evaluation              AM-PAC OT "6 Clicks" Daily Activity     Outcome Measure Help from  another person eating meals?: None Help from another person taking care of personal grooming?: None Help from another person toileting, which includes using toliet, bedpan, or urinal?: None Help from another person bathing (including washing, rinsing, drying)?: None Help from another person to put on and taking off regular upper body clothing?: None Help from another person to put on and taking off regular lower body clothing?: None 6 Click Score: 24   End of Session Nurse Communication: Mobility status  Activity Tolerance: Patient tolerated treatment well Patient left: in bed  OT Visit Diagnosis: Unsteadiness on feet (R26.81)                Time: 1610-9604 OT Time Calculation (min): 22 min Charges:  OT General Charges $OT Visit: 1 Visit OT Evaluation $OT Eval Moderate Complexity: 1 Mod  11/05/2020  Rich, OTR/L  Acute Rehabilitation Services  Office:  [phone]   Suzanna Obey 11/05/2020, 9:17 AM

## 2020-11-05 NOTE — Plan of Care (Signed)
Patient is discharged from room 3C04 at this time. Alert and in stable condition. IV site d/c'd and instructions read to patient with understanding verbalized and all questions answered. Left unit via wheelchair with all belongings at side. 

## 2020-11-05 NOTE — Discharge Summary (Signed)
  Physician Discharge Summary  Patient ID: Juan Long MRN: [id number] DOB/AGE: 25-Jun-1976 44 y.o.  Admit date: 11/04/2020 Discharge date: 11/05/2020  Admission Diagnoses:  Right Sacroiliitis  Discharge Diagnoses:  Same Active Problems:   Sacroiliitis [facility])   Discharged Condition: Stable  Hospital Course:  Juan Long is a 45 y.o. male that presented for a right SI fusion, he tolerated the surgery well without complication. He was ambulating postop and his preop pain was improved. He was tolerating a diet, having normal bowel/bladder function at discharge and at his neurologic baseline upon discharge. PT worked with him agreed he could go home.  Treatments: Surgery - Right SI fusion, 11/04/2020  Discharge Exam: Blood pressure 122/77, pulse 73, temperature 97.7 F (36.5 C), temperature source Oral, resp. rate 17, height 5\' 11"  (1.803 m), weight 115.7 kg, SpO2 97 %. Awake, alert, oriented Speech fluent, appropriate CN grossly intact 5/5 BUE/BLE Wound c/d/i  Disposition: Discharge disposition: 01-Home or Self Care       Discharge Instructions    Incentive spirometry RT   Complete by: As directed      Allergies as of 11/05/2020   No Known Allergies     Medication List    STOP taking these medications   naproxen 500 MG tablet Commonly known as: NAPROSYN     TAKE these medications   aspirin EC 81 MG tablet Take 1 tablet (81 mg total) by mouth daily. Start taking on: November 26, 2020 What changed: These instructions start on November 26, 2020. If you are unsure what to do until then, ask your doctor or other care provider.   busPIRone 10 MG tablet Commonly known as: BUSPAR Take 10 mg by mouth 2 (two) times daily.   cyclobenzaprine 10 MG tablet Commonly known as: FLEXERIL Take 1 tablet (10 mg total) by mouth 3 (three) times daily as needed for muscle spasms. What changed: when to take this   FLUoxetine 20 MG tablet Commonly known as: PROZAC Take 20 mg by  mouth 3 (three) times daily.   HYDROcodone-acetaminophen 10-325 MG tablet Commonly known as: NORCO Take 1 tablet by mouth every 6 (six) hours as needed for moderate pain ((score 4 to 6)).   LUBRICANT EYE DROPS OP Place 1 drop into both eyes 3 (three) times daily.   traMADol 50 MG tablet Commonly known as: Ultram Take 1 tablet (50 mg total) by mouth every 6 (six) hours as needed.   Vitamin D3 50 MCG (2000 UT) Tabs Take 2,000 Units by mouth daily.   vitamin E 180 MG (400 UNITS) capsule Take 400 Units by mouth daily.       Follow-up Information    Macai Sisneros C, DO Follow up in 1 month(s).   Contact information: [address] [place] [phone]               Signed: [phone] Bethany Hirt 11/05/2020, 9:16 AM

## 2020-11-05 NOTE — Evaluation (Signed)
Physical Therapy Evaluation and Discharge Patient Details Name: Juan Long MRN: [id number] DOB: 26-Sep-1975 Today's Date: 11/05/2020   History of Present Illness  Pt is a 45 y/o male who presents s/p SI joint fusion on 11/04/2020. PMH significant for HTN, prior cervical surgery and back surgery.    Clinical Impression  Patient evaluated by Physical Therapy with no further acute PT needs identified. All education has been completed and the patient has no further questions. Pt was able to demonstrate transfers and ambulation with gross modified independence and no AD. Pt was educated on precautions, brace application/wearing schedule, appropriate activity progression, and car transfer. See below for any follow-up Physical Therapy or equipment needs. PT is signing off. Thank you for this referral.     Follow Up Recommendations No PT follow up;Supervision - Intermittent    Equipment Recommendations  None recommended by PT    Recommendations for Other Services       Precautions / Restrictions Precautions Precautions: Fall;Back Precaution Booklet Issued: Yes (comment) Precaution Comments: Reviewed handout and pt was cued for precautions during functional mobility. Restrictions Weight Bearing Restrictions: No      Mobility  Bed Mobility Overal bed mobility: Modified Independent             General bed mobility comments: Pt demonstrated log roll technique with HOB flat to simulate home environment. Pt with min use of rails but feel he would have been able to complete without railings.    Transfers Overall transfer level: Independent Equipment used: None             General transfer comment: No assist to power-up to full stand. Pt demonstrated good hand placement on seated surface for safety.  Ambulation/Gait Ambulation/Gait assistance: Modified independent (Device/Increase time) Gait Distance (Feet): 300 Feet Assistive device: None Gait Pattern/deviations:  Step-through pattern;Decreased stride length;Wide base of support Gait velocity: Decreased Gait velocity interpretation: 1.31 - 2.62 ft/sec, indicative of limited community ambulator General Gait Details: Pt with minimal knee flexion and wide BOS throughout gait training. Likely baseline. No overt LOB noted.  Stairs         General stair comments: Pt has 1 STE but pt declined practicing  Wheelchair Mobility    Modified Rankin (Stroke Patients Only)       Balance Overall balance assessment: No apparent balance deficits (not formally assessed)                                           Pertinent Vitals/Pain Pain Assessment: Faces Faces Pain Scale: Hurts a little bit Pain Location: Incision site Pain Descriptors / Indicators: Operative site guarding Pain Intervention(s): Limited activity within patient's tolerance;Monitored during session;Repositioned    Home Living Family/patient expects to be discharged to:: Private residence Living Arrangements: Spouse/significant other Available Help at Discharge: Family;Available 24 hours/day Type of Home: House Home Access: Stairs to enter   [employer] of Steps: 1 Home Layout: One level Home Equipment: Cane - single point      Prior Function Level of Independence: Independent         Comments: Retired Designer, fashion/clothing. On disability     Hand Dominance   Dominant Hand: Right    Extremity/Trunk Assessment   Upper Extremity Assessment Upper Extremity Assessment: Defer to OT evaluation    Lower Extremity Assessment Lower Extremity Assessment: Overall WFL for tasks assessed    Cervical /  Trunk Assessment Cervical / Trunk Assessment: Other exceptions Cervical / Trunk Exceptions: s/p surgery  Communication   Communication: No difficulties  Cognition Arousal/Alertness: Awake/alert Behavior During Therapy: WFL for tasks assessed/performed Overall Cognitive Status: Within Functional  Limits for tasks assessed                                        General Comments      Exercises     Assessment/Plan    PT Assessment Patent does not need any further PT services  PT Problem List         PT Treatment Interventions      PT Goals (Current goals can be found in the Care Plan section)  Acute Rehab PT Goals Patient Stated Goal: [place] PT Goal Formulation: All assessment and education complete, DC therapy    Frequency     Barriers to discharge        Co-evaluation               AM-PAC PT "6 Clicks" Mobility  Outcome Measure Help needed turning from your back to your side while in a flat bed without using bedrails?: None Help needed moving from lying on your back to sitting on the side of a flat bed without using bedrails?: None Help needed moving to and from a bed to a chair (including a wheelchair)?: None Help needed standing up from a chair using your arms (e.g., wheelchair or bedside chair)?: None Help needed to walk in hospital room?: None Help needed climbing 3-5 steps with a railing? : None 6 Click Score: 24    End of Session   Activity Tolerance: Patient tolerated treatment well Patient left: with call bell/phone within reach (Sitting EOB) Nurse Communication: Mobility status PT Visit Diagnosis: Unsteadiness on feet (R26.81);Pain Pain - part of body:  (back)    Time: 1443-1540 PT Time Calculation (min) (ACUTE ONLY): 12 min   Charges:   PT Evaluation $PT Eval Low Complexity: 1 Low          Conni Slipper, PT, DPT Acute Rehabilitation Services Pager: [phone] Office: [phone]   Marylynn Pearson 11/05/2020, 9:02 AM

## 2021-01-22 ENCOUNTER — Other Ambulatory Visit: Payer: Self-pay | Admitting: Neurological Surgery

## 2021-02-13 NOTE — Progress Notes (Signed)
Surgical Instructions    Your procedure is scheduled on June 10  Report to [facility] Main Entrance "A" at 0830 A.M., then check in with the Admitting office.  Call this number if you have problems the morning of surgery:  [phone]   If you have any questions prior to your surgery date call [phone]: Open Monday-Friday 8am-4pm    Remember:  Do not eat or drink after midnight the night before your surgery     Take these medicines the morning of surgery with A SIP OF WATER  busPIRone (BUSPAR)  Eye drops if needed  cyclobenzaprine (FLEXERIL) if needed FLUoxetine (PROZAC) HYDROcodone-acetaminophen (NORCO) if needed  Follow your surgeon's instructions on when to stop Aspirin.  If no instructions were given by your surgeon then you will need to call the office to get those instructions.    As of today, STOP taking any Aleve, Naproxen, Ibuprofen, Motrin, Advil, Goody's, BC's, all herbal medications, fish oil, and all vitamins.                     Do not wear jewelry, make up, or nail polish DO Not wear nail polish, gel polish, artificial nails, or any other type of covering on natural nails including finger and toenails. If patients have artificial nails, gel coating, etc. that need to be removed by a nail saloon please have this removed prior to surgery or surgery may need to be canceled/delayed if the surgeon/ anesthesia feels like the patient is unable to be adequately monitored.            Do not wear lotions, powders, perfumes/colognes, or deodorant.            Do not shave 48 hours prior to surgery.  Men may shave face and neck.            Do not bring valuables to the hospital.            [facility] is not responsible for any belongings or valuables.  Do NOT Smoke (Tobacco/Vaping) or drink Alcohol 24 hours prior to your procedure If you use a CPAP at night, you may bring all equipment for your overnight stay.   Contacts, glasses, dentures or bridgework may not be worn  into surgery, please bring cases for these belongings   For patients admitted to the hospital, discharge time will be determined by your treatment team.   Patients discharged the day of surgery will not be allowed to drive home, and someone needs to stay with them for 24 hours.    Special instructions:    Oral Hygiene is also important to reduce your risk of infection.  Remember - BRUSH YOUR TEETH THE MORNING OF SURGERY WITH YOUR REGULAR TOOTHPASTE   [place]- Preparing For Surgery  Before surgery, you can play an important role. Because skin is not sterile, your skin needs to be as free of germs as possible. You can reduce the number of germs on your skin by washing with CHG (chlorahexidine gluconate) Soap before surgery.  CHG is an antiseptic cleaner which kills germs and bonds with the skin to continue killing germs even after washing.     Please do not use if you have an allergy to CHG or antibacterial soaps. If your skin becomes reddened/irritated stop using the CHG.  Do not shave (including legs and underarms) for at least 48 hours prior to first CHG shower. It is OK to shave your face.  Please follow  these instructions carefully.    1.  Shower the NIGHT BEFORE SURGERY and the MORNING OF SURGERY with CHG Soap.   If you chose to wash your hair, wash your hair first as usual with your normal shampoo. After you shampoo, rinse your hair and body thoroughly to remove the shampoo.  Then [employer] and genitals (private parts) with your normal soap and rinse thoroughly to remove soap.  2. After that Use CHG Soap as you would any other liquid soap. You can apply CHG directly to the skin and wash gently with a scrungie or a clean washcloth.   3. Apply the CHG Soap to your body ONLY FROM THE NECK DOWN.  Do not use on open wounds or open sores. Avoid contact with your eyes, ears, mouth and genitals (private parts). Wash Face and genitals (private parts)  with your normal soap.   4. Wash  thoroughly, paying special attention to the area where your surgery will be performed.  5. Thoroughly rinse your body with warm water from the neck down.  6. DO NOT shower/wash with your normal soap after using and rinsing off the CHG Soap.  7. Pat yourself dry with a CLEAN TOWEL.  8. Wear CLEAN PAJAMAS to bed the night before surgery  9. Place CLEAN SHEETS on your bed the night before your surgery  10. DO NOT SLEEP WITH PETS.   Day of Surgery:  Take a shower with CHG soap. Wear Clean/Comfortable clothing the morning of surgery Do not apply any deodorants/lotions.   Remember to brush your teeth WITH YOUR REGULAR TOOTHPASTE.   Please read over the following fact sheets that you were given.

## 2021-02-17 ENCOUNTER — Other Ambulatory Visit: Payer: Self-pay

## 2021-02-17 ENCOUNTER — Ambulatory Visit (HOSPITAL_COMMUNITY)
Admission: RE | Admit: 2021-02-17 | Discharge: 2021-02-17 | Disposition: A | Discharge status: Home / Self Care | Payer: No Typology Code available for payment source | Source: Ambulatory Visit | Attending: Neurological Surgery | Admitting: Neurological Surgery

## 2021-02-17 ENCOUNTER — Encounter (HOSPITAL_COMMUNITY): Payer: Self-pay

## 2021-02-17 DIAGNOSIS — Z01818 Encounter for other preprocedural examination: Secondary | ICD-10-CM | POA: Insufficient documentation

## 2021-02-17 LAB — CBC WITH DIFFERENTIAL/PLATELET
Abs Immature Granulocytes: 0.01 10*3/uL (ref 0.00–0.07)
Basophils Absolute: 0 10*3/uL (ref 0.0–0.1)
Basophils Relative: 0 %
Eosinophils Absolute: 0.1 10*3/uL (ref 0.0–0.5)
Eosinophils Relative: 2 %
HCT: 46.2 % (ref 39.0–52.0)
Hemoglobin: 15 g/dL (ref 13.0–17.0)
Immature Granulocytes: 0 %
Lymphocytes Relative: 29 %
Lymphs Abs: 1.8 10*3/uL (ref 0.7–4.0)
MCH: 29.5 pg (ref 26.0–34.0)
MCHC: 32.5 g/dL (ref 30.0–36.0)
MCV: 90.8 fL (ref 80.0–100.0)
Monocytes Absolute: 0.4 10*3/uL (ref 0.1–1.0)
Monocytes Relative: 7 %
Neutro Abs: 3.8 10*3/uL (ref 1.7–7.7)
Neutrophils Relative %: 62 %
Platelets: 384 10*3/uL (ref 150–400)
RBC: 5.09 MIL/uL (ref 4.22–5.81)
RDW: 14.1 % (ref 11.5–15.5)
WBC: 6.2 10*3/uL (ref 4.0–10.5)
nRBC: 0 % (ref 0.0–0.2)

## 2021-02-17 LAB — BASIC METABOLIC PANEL
Anion gap: 10 (ref 5–15)
BUN: 13 mg/dL (ref 6–20)
CO2: 23 mmol/L (ref 22–32)
Calcium: 9.3 mg/dL (ref 8.9–10.3)
Chloride: 106 mmol/L (ref 98–111)
Creatinine, Ser: 0.85 mg/dL (ref 0.61–1.24)
GFR, Estimated: >60 mL/min (ref >60)
Glucose, Bld: 105 mg/dL — ABNORMAL HIGH (ref 70–99)
Potassium: 4 mmol/L (ref 3.5–5.1)
Sodium: 139 mmol/L (ref 135–145)

## 2021-02-17 LAB — SURGICAL PCR SCREEN
MRSA, PCR: NEGATIVE
Staphylococcus aureus: NEGATIVE

## 2021-02-17 LAB — TYPE AND SCREEN
ABO/RH(D): O POS
Antibody Screen: NEGATIVE

## 2021-02-17 LAB — PROTIME-INR
INR: 1 (ref 0.8–1.2)
Prothrombin Time: 12.7 seconds (ref 11.4–15.2)

## 2021-02-17 NOTE — Progress Notes (Signed)
PCP - [facility] Cardiologist - denies   Chest x-ray - denies EKG - 02/17/21 Stress Test - denies ECHO - denies Cardiac Cath - denies  Sleep Study - 2 years ago? - At [facility] [place] CPAP - at night  Aspirin Instructions: LD hasnt taken since March 2022   COVID TEST- 02/24/21   Anesthesia review: NO  Patient denies shortness of breath, fever, cough and chest pain at PAT appointment   All instructions explained to the patient, with a verbal understanding of the material. Patient agrees to go over the instructions while at home for a better understanding. Patient also instructed to self quarantine after being tested for COVID-19. The opportunity to ask questions was provided.

## 2021-02-24 ENCOUNTER — Other Ambulatory Visit (HOSPITAL_COMMUNITY)
Admission: RE | Admit: 2021-02-24 | Discharge: 2021-02-24 | Disposition: A | Discharge status: Home / Self Care | Payer: No Typology Code available for payment source | Source: Ambulatory Visit | Attending: Neurological Surgery | Admitting: Neurological Surgery

## 2021-02-24 DIAGNOSIS — Z20822 Contact with and (suspected) exposure to covid-19: Secondary | ICD-10-CM | POA: Insufficient documentation

## 2021-02-24 DIAGNOSIS — Z01812 Encounter for preprocedural laboratory examination: Secondary | ICD-10-CM | POA: Insufficient documentation

## 2021-02-24 LAB — SARS CORONAVIRUS 2 (TAT 6-24 HRS): SARS Coronavirus 2: NEGATIVE

## 2021-02-27 ENCOUNTER — Observation Stay (HOSPITAL_COMMUNITY)
Admission: RE | Admit: 2021-02-27 | Discharge: 2021-02-28 | Disposition: A | Discharge status: Home / Self Care | Payer: No Typology Code available for payment source | Attending: Neurological Surgery | Admitting: Neurological Surgery

## 2021-02-27 ENCOUNTER — Ambulatory Visit (HOSPITAL_COMMUNITY): Payer: No Typology Code available for payment source | Admitting: Certified Registered Nurse Anesthetist

## 2021-02-27 ENCOUNTER — Encounter (HOSPITAL_COMMUNITY): Payer: Self-pay | Admitting: Neurological Surgery

## 2021-02-27 ENCOUNTER — Encounter (HOSPITAL_COMMUNITY)
Admission: RE | Disposition: A | Discharge status: Home / Self Care | Payer: Self-pay | Source: Home / Self Care | Attending: Neurological Surgery

## 2021-02-27 ENCOUNTER — Ambulatory Visit (HOSPITAL_COMMUNITY): Payer: No Typology Code available for payment source

## 2021-02-27 ENCOUNTER — Other Ambulatory Visit: Payer: Self-pay

## 2021-02-27 DIAGNOSIS — Z79899 Other long term (current) drug therapy: Secondary | ICD-10-CM | POA: Insufficient documentation

## 2021-02-27 DIAGNOSIS — M461 Sacroiliitis, not elsewhere classified: Principal | ICD-10-CM | POA: Insufficient documentation

## 2021-02-27 DIAGNOSIS — Z87891 Personal history of nicotine dependence: Secondary | ICD-10-CM | POA: Diagnosis not present

## 2021-02-27 DIAGNOSIS — Z7982 Long term (current) use of aspirin: Secondary | ICD-10-CM | POA: Insufficient documentation

## 2021-02-27 DIAGNOSIS — I10 Essential (primary) hypertension: Secondary | ICD-10-CM | POA: Insufficient documentation

## 2021-02-27 DIAGNOSIS — Z419 Encounter for procedure for purposes other than remedying health state, unspecified: Secondary | ICD-10-CM

## 2021-02-27 HISTORY — PX: SACROILIAC JOINT FUSION: SHX6088

## 2021-02-27 SURGERY — SACROILIAC JOINT FUSION
Anesthesia: General | Site: Hip | Laterality: Left

## 2021-02-27 MED ORDER — LIDOCAINE 2% (20 MG/ML) 5 ML SYRINGE
INTRAMUSCULAR | Status: DC | PRN
  Administered 2021-02-27: 60 mg via INTRAVENOUS

## 2021-02-27 MED ORDER — GABAPENTIN 300 MG PO CAPS
300.0000 mg | ORAL_CAPSULE | Freq: Three times a day (TID) | ORAL | Status: DC
  Administered 2021-02-27 – 2021-02-28 (×3): 300 mg via ORAL
  Filled 2021-02-27 (×3): qty 1

## 2021-02-27 MED ORDER — SUCCINYLCHOLINE CHLORIDE 200 MG/10ML IV SOSY
PREFILLED_SYRINGE | INTRAVENOUS | Status: DC | PRN
  Administered 2021-02-27: 140 mg via INTRAVENOUS

## 2021-02-27 MED ORDER — ACETAMINOPHEN 10 MG/ML IV SOLN
INTRAVENOUS | Status: AC
  Filled 2021-02-27: qty 100

## 2021-02-27 MED ORDER — ROCURONIUM BROMIDE 10 MG/ML (PF) SYRINGE
PREFILLED_SYRINGE | INTRAVENOUS | Status: AC
  Filled 2021-02-27: qty 30

## 2021-02-27 MED ORDER — CHLORHEXIDINE GLUCONATE 0.12 % MT SOLN
15.0000 mL | Freq: Once | OROMUCOSAL | Status: AC
  Administered 2021-02-27: 15 mL via OROMUCOSAL
  Filled 2021-02-27: qty 15

## 2021-02-27 MED ORDER — OXYCODONE HCL 5 MG PO TABS
10.0000 mg | ORAL_TABLET | ORAL | Status: DC | PRN
  Administered 2021-02-27 – 2021-02-28 (×6): 10 mg via ORAL
  Filled 2021-02-27 (×6): qty 2

## 2021-02-27 MED ORDER — MIDAZOLAM HCL 2 MG/2ML IJ SOLN
INTRAMUSCULAR | Status: AC
  Filled 2021-02-27: qty 2

## 2021-02-27 MED ORDER — ACETAMINOPHEN 325 MG PO TABS: 650.0000 mg | ORAL_TABLET | ORAL | Status: DC | PRN

## 2021-02-27 MED ORDER — ACETAMINOPHEN 10 MG/ML IV SOLN
INTRAVENOUS | Status: DC | PRN
  Administered 2021-02-27: 1000 mg via INTRAVENOUS

## 2021-02-27 MED ORDER — ROCURONIUM BROMIDE 10 MG/ML (PF) SYRINGE
PREFILLED_SYRINGE | INTRAVENOUS | Status: DC | PRN
  Administered 2021-02-27 (×2): 50 mg via INTRAVENOUS

## 2021-02-27 MED ORDER — EPHEDRINE SULFATE-NACL 50-0.9 MG/10ML-% IV SOSY
PREFILLED_SYRINGE | INTRAVENOUS | Status: DC | PRN
  Administered 2021-02-27 (×2): 5 mg via INTRAVENOUS

## 2021-02-27 MED ORDER — ORAL CARE MOUTH RINSE: 15.0000 mL | Freq: Once | OROMUCOSAL | Status: AC

## 2021-02-27 MED ORDER — PROPOFOL 10 MG/ML IV BOLUS
INTRAVENOUS | Status: AC
  Filled 2021-02-27: qty 20

## 2021-02-27 MED ORDER — HYDROMORPHONE HCL 1 MG/ML IJ SOLN
INTRAMUSCULAR | Status: AC
  Filled 2021-02-27: qty 1

## 2021-02-27 MED ORDER — DEXAMETHASONE SODIUM PHOSPHATE 10 MG/ML IJ SOLN
INTRAMUSCULAR | Status: AC
  Filled 2021-02-27: qty 1

## 2021-02-27 MED ORDER — FENTANYL CITRATE (PF) 250 MCG/5ML IJ SOLN
INTRAMUSCULAR | Status: AC
  Filled 2021-02-27: qty 5

## 2021-02-27 MED ORDER — BUSPIRONE HCL 10 MG PO TABS
10.0000 mg | ORAL_TABLET | Freq: Two times a day (BID) | ORAL | Status: DC
  Administered 2021-02-27 – 2021-02-28 (×2): 10 mg via ORAL
  Filled 2021-02-27 (×2): qty 1

## 2021-02-27 MED ORDER — THROMBIN 5000 UNITS EX SOLR
CUTANEOUS | Status: AC
  Filled 2021-02-27: qty 5000

## 2021-02-27 MED ORDER — LIDOCAINE-EPINEPHRINE 1 %-1:100000 IJ SOLN
INTRAMUSCULAR | Status: AC
  Filled 2021-02-27: qty 1

## 2021-02-27 MED ORDER — SODIUM CHLORIDE 0.9% FLUSH: 3.0000 mL | INTRAVENOUS | Status: DC | PRN

## 2021-02-27 MED ORDER — CHLORHEXIDINE GLUCONATE CLOTH 2 % EX PADS: 6.0000 | MEDICATED_PAD | Freq: Once | CUTANEOUS | Status: DC

## 2021-02-27 MED ORDER — SODIUM CHLORIDE 0.9% FLUSH
3.0000 mL | Freq: Two times a day (BID) | INTRAVENOUS | Status: DC
  Administered 2021-02-27 (×2): 3 mL via INTRAVENOUS

## 2021-02-27 MED ORDER — LACTATED RINGERS IV SOLN: INTRAVENOUS | Status: DC

## 2021-02-27 MED ORDER — EPHEDRINE 5 MG/ML INJ
INTRAVENOUS | Status: AC
  Filled 2021-02-27: qty 10

## 2021-02-27 MED ORDER — LIDOCAINE-EPINEPHRINE 1 %-1:100000 IJ SOLN
INTRAMUSCULAR | Status: DC | PRN
  Administered 2021-02-27: 5 mL

## 2021-02-27 MED ORDER — 0.9 % SODIUM CHLORIDE (POUR BTL) OPTIME
TOPICAL | Status: DC | PRN
  Administered 2021-02-27: 1000 mL

## 2021-02-27 MED ORDER — ACETAMINOPHEN 650 MG RE SUPP: 650.0000 mg | RECTAL | Status: DC | PRN

## 2021-02-27 MED ORDER — BUPIVACAINE HCL (PF) 0.5 % IJ SOLN
INTRAMUSCULAR | Status: AC
  Filled 2021-02-27: qty 30

## 2021-02-27 MED ORDER — BUPIVACAINE LIPOSOME 1.3 % IJ SUSP
INTRAMUSCULAR | Status: AC
  Filled 2021-02-27: qty 20

## 2021-02-27 MED ORDER — BUPIVACAINE LIPOSOME 1.3 % IJ SUSP
INTRAMUSCULAR | Status: DC | PRN
  Administered 2021-02-27: 20 mL

## 2021-02-27 MED ORDER — CYCLOBENZAPRINE HCL 10 MG PO TABS
10.0000 mg | ORAL_TABLET | Freq: Three times a day (TID) | ORAL | Status: DC | PRN
  Administered 2021-02-27 – 2021-02-28 (×3): 10 mg via ORAL
  Filled 2021-02-27 (×3): qty 1

## 2021-02-27 MED ORDER — FLUOXETINE HCL 20 MG PO CAPS
20.0000 mg | ORAL_CAPSULE | Freq: Three times a day (TID) | ORAL | Status: DC
  Administered 2021-02-27 – 2021-02-28 (×3): 20 mg via ORAL
  Filled 2021-02-27 (×3): qty 1

## 2021-02-27 MED ORDER — ONDANSETRON HCL 4 MG/2ML IJ SOLN: 4.0000 mg | Freq: Four times a day (QID) | INTRAMUSCULAR | Status: DC | PRN

## 2021-02-27 MED ORDER — FENTANYL CITRATE (PF) 100 MCG/2ML IJ SOLN: 25.0000 ug | INTRAMUSCULAR | Status: DC | PRN

## 2021-02-27 MED ORDER — FENTANYL CITRATE (PF) 250 MCG/5ML IJ SOLN
INTRAMUSCULAR | Status: DC | PRN
  Administered 2021-02-27: 100 ug via INTRAVENOUS

## 2021-02-27 MED ORDER — HYDROMORPHONE HCL 1 MG/ML IJ SOLN: 0.5000 mg | INTRAMUSCULAR | Status: DC | PRN

## 2021-02-27 MED ORDER — SODIUM CHLORIDE 0.9 % IV SOLN: 250.0000 mL | INTRAVENOUS | Status: DC

## 2021-02-27 MED ORDER — CEFAZOLIN IN SODIUM CHLORIDE 3-0.9 GM/100ML-% IV SOLN
3.0000 g | INTRAVENOUS | Status: AC
  Administered 2021-02-27: 3 g via INTRAVENOUS
  Filled 2021-02-27: qty 100

## 2021-02-27 MED ORDER — KETOROLAC TROMETHAMINE 15 MG/ML IJ SOLN
15.0000 mg | Freq: Four times a day (QID) | INTRAMUSCULAR | Status: AC
  Administered 2021-02-27 – 2021-02-28 (×4): 15 mg via INTRAVENOUS
  Filled 2021-02-27 (×4): qty 1

## 2021-02-27 MED ORDER — SODIUM CHLORIDE 0.9 % IV SOLN: INTRAVENOUS | Status: DC

## 2021-02-27 MED ORDER — PROPOFOL 10 MG/ML IV BOLUS
INTRAVENOUS | Status: DC | PRN
  Administered 2021-02-27: 150 mg via INTRAVENOUS

## 2021-02-27 MED ORDER — LIDOCAINE HCL (PF) 2 % IJ SOLN
INTRAMUSCULAR | Status: AC
  Filled 2021-02-27: qty 15

## 2021-02-27 MED ORDER — SUCCINYLCHOLINE CHLORIDE 200 MG/10ML IV SOSY
PREFILLED_SYRINGE | INTRAVENOUS | Status: AC
  Filled 2021-02-27: qty 10

## 2021-02-27 MED ORDER — HYDROMORPHONE HCL 1 MG/ML IJ SOLN
0.2500 mg | INTRAMUSCULAR | Status: DC | PRN
  Administered 2021-02-27 (×2): 0.5 mg via INTRAVENOUS

## 2021-02-27 MED ORDER — HYDROCODONE-ACETAMINOPHEN 10-325 MG PO TABS
1.0000 | ORAL_TABLET | ORAL | Status: DC | PRN
Start: 2021-02-27 — End: 2021-02-28

## 2021-02-27 MED ORDER — ONDANSETRON HCL 4 MG/2ML IJ SOLN
INTRAMUSCULAR | Status: DC | PRN
  Administered 2021-02-27: 4 mg via INTRAVENOUS

## 2021-02-27 MED ORDER — SUGAMMADEX SODIUM 200 MG/2ML IV SOLN
INTRAVENOUS | Status: DC | PRN
  Administered 2021-02-27: 400 mg via INTRAVENOUS

## 2021-02-27 MED ORDER — PHENYLEPHRINE HCL-NACL 10-0.9 MG/250ML-% IV SOLN
INTRAVENOUS | Status: DC | PRN
  Administered 2021-02-27: 25 ug/min via INTRAVENOUS

## 2021-02-27 MED ORDER — DEXAMETHASONE SODIUM PHOSPHATE 10 MG/ML IJ SOLN
INTRAMUSCULAR | Status: DC | PRN
  Administered 2021-02-27: 10 mg via INTRAVENOUS

## 2021-02-27 MED ORDER — MIDAZOLAM HCL 2 MG/2ML IJ SOLN
2.0000 mg | Freq: Once | INTRAMUSCULAR | Status: AC
  Administered 2021-02-27: 2 mg via INTRAVENOUS
  Filled 2021-02-27: qty 2

## 2021-02-27 MED ORDER — SENNOSIDES-DOCUSATE SODIUM 8.6-50 MG PO TABS: 1.0000 | ORAL_TABLET | Freq: Every evening | ORAL | Status: DC | PRN

## 2021-02-27 MED ORDER — ONDANSETRON HCL 4 MG PO TABS: 4.0000 mg | ORAL_TABLET | Freq: Four times a day (QID) | ORAL | Status: DC | PRN

## 2021-02-27 MED ORDER — PHENOL 1.4 % MT LIQD: 1.0000 | OROMUCOSAL | Status: DC | PRN

## 2021-02-27 MED ORDER — ONDANSETRON HCL 4 MG/2ML IJ SOLN
INTRAMUSCULAR | Status: AC
  Filled 2021-02-27: qty 2

## 2021-02-27 MED ORDER — CEFAZOLIN SODIUM-DEXTROSE 2-4 GM/100ML-% IV SOLN
2.0000 g | Freq: Three times a day (TID) | INTRAVENOUS | Status: AC
  Administered 2021-02-27 – 2021-02-28 (×2): 2 g via INTRAVENOUS
  Filled 2021-02-27 (×2): qty 100

## 2021-02-27 MED ORDER — MENTHOL 3 MG MT LOZG: 1.0000 | LOZENGE | OROMUCOSAL | Status: DC | PRN

## 2021-02-27 MED ORDER — BUPIVACAINE HCL (PF) 0.5 % IJ SOLN
INTRAMUSCULAR | Status: DC | PRN
  Administered 2021-02-27: 5 mL

## 2021-02-27 SURGICAL SUPPLY — 51 items
COVER WAND RF STERILE (DRAPES) IMPLANT
DECANTER SPIKE VIAL GLASS SM (MISCELLANEOUS) ×3 IMPLANT
DERMABOND ADVANCED (GAUZE/BANDAGES/DRESSINGS) ×2
DERMABOND ADVANCED .7 DNX12 (GAUZE/BANDAGES/DRESSINGS) ×1 IMPLANT
DRAIN JACKSON RD 7FR 3/32 (WOUND CARE) IMPLANT
DRAPE C-ARM 42X72 X-RAY (DRAPES) ×3 IMPLANT
DRAPE C-ARMOR (DRAPES) ×3 IMPLANT
DRAPE LAPAROTOMY 100X72X124 (DRAPES) ×3 IMPLANT
DRAPE SURG 17X23 STRL (DRAPES) ×3 IMPLANT
DRSG OPSITE POSTOP 3X4 (GAUZE/BANDAGES/DRESSINGS) ×3 IMPLANT
DURAPREP 26ML APPLICATOR (WOUND CARE) ×3 IMPLANT
ELECT BLADE INSULATED 4IN (ELECTROSURGICAL) ×3
ELECT REM PT RETURN 9FT ADLT (ELECTROSURGICAL) ×3
ELECTRODE BLADE INSULATED 4IN (ELECTROSURGICAL) ×1 IMPLANT
ELECTRODE REM PT RTRN 9FT ADLT (ELECTROSURGICAL) ×1 IMPLANT
GAUZE 4X4 16PLY RFD (DISPOSABLE) IMPLANT
GAUZE SPONGE 4X4 12PLY STRL (GAUZE/BANDAGES/DRESSINGS) ×3 IMPLANT
GLOVE ECLIPSE 8.0 STRL XLNG CF (GLOVE) ×3 IMPLANT
GLOVE SRG 8 PF TXTR STRL LF DI (GLOVE) ×1 IMPLANT
GLOVE SURG UNDER POLY LF SZ8 (GLOVE) ×3
GOWN STRL REUS W/ TWL LRG LVL3 (GOWN DISPOSABLE) IMPLANT
GOWN STRL REUS W/ TWL XL LVL3 (GOWN DISPOSABLE) ×4 IMPLANT
GOWN STRL REUS W/TWL 2XL LVL3 (GOWN DISPOSABLE) IMPLANT
GOWN STRL REUS W/TWL LRG LVL3 (GOWN DISPOSABLE)
GOWN STRL REUS W/TWL XL LVL3 (GOWN DISPOSABLE) ×12
GUIDE PIN IFUSE DISP 3.2 (PIN) ×9
IMPL IFUSE 7.0X45 (Rod) ×1 IMPLANT
IMPLANT IFUSE 7.0MMX40MM (Rod) ×6 IMPLANT
IMPLANT IFUSE 7.0X45 (Rod) ×3 IMPLANT
KIT BASIN OR (CUSTOM PROCEDURE TRAY) ×3 IMPLANT
KIT POSITION SURG JACKSON T1 (MISCELLANEOUS) ×3 IMPLANT
KIT TURNOVER KIT B (KITS) ×3 IMPLANT
NEEDLE HYPO 21X1.5 SAFETY (NEEDLE) ×3 IMPLANT
NEEDLE HYPO 25X1 1.5 SAFETY (NEEDLE) ×3 IMPLANT
NS IRRIG 1000ML POUR BTL (IV SOLUTION) ×3 IMPLANT
PACK LAMINECTOMY NEURO (CUSTOM PROCEDURE TRAY) ×3 IMPLANT
PAD ARMBOARD 7.5X6 YLW CONV (MISCELLANEOUS) ×9 IMPLANT
PATTIES SURGICAL .5 X1 (DISPOSABLE) IMPLANT
PIN BLUNT IFUSE DISP 3.2 (PIN) ×3 IMPLANT
PIN EXCHANGE IFUSE DISP 3.2 (PIN) ×3 IMPLANT
PIN GUIDE IFUSE DISP 3.2 (PIN) ×3 IMPLANT
PUTTY BONE 100 VESUVIUS 1CC (Putty) ×6 IMPLANT
SPONGE LAP 4X18 RFD (DISPOSABLE) IMPLANT
STAPLER VISISTAT 35W (STAPLE) ×3 IMPLANT
SUT VIC AB 2-0 CP2 18 (SUTURE) ×3 IMPLANT
SYR 20CC LL (SYRINGE) ×3 IMPLANT
SYS SPNL FX3ANG 40X7X (Rod) ×2 IMPLANT
SYSTEM SPNL FX3ANG 40X7X (Rod) ×2 IMPLANT
TOWEL GREEN STERILE (TOWEL DISPOSABLE) IMPLANT
TOWEL GREEN STERILE FF (TOWEL DISPOSABLE) IMPLANT
WATER STERILE IRR 1000ML POUR (IV SOLUTION) ×3 IMPLANT

## 2021-02-27 NOTE — Evaluation (Signed)
Occupational Therapy Evaluation and Discharge Patient Details Name: Juan Long MRN: [id number] DOB: 09-18-1976 Today's Date: 02/27/2021    History of Present Illness Pt is a 45 yo male s/p left minimally invasive sacroiliac arthrodesis, SI bone iFuse implants. Had right side done ~3 months ago.   Clinical Impression   This 45 yo male admitted and underwent above presents to acute OT with all education completed and is at a Mod I to independent level. No acute PT needs identified and I have made them aware. Acute OT will sign off.    Follow Up Recommendations  No OT follow up    Equipment Recommendations  Other (comment) (rollator)       Precautions / Restrictions Precautions Precautions: None Precaution Comments: Per pt MD told him he could put as much weight as tolerated on LLE Restrictions Weight Bearing Restrictions: No      Mobility Bed Mobility Overal bed mobility: Independent                  Transfers Overall transfer level: Modified independent Equipment used: Rolling walker (2 wheeled)             General transfer comment: Mod I up and down 3 steps without rails using a step-to pattern.    Balance Overall balance assessment: Mild deficits observed, not formally tested                                         ADL either performed or assessed with clinical judgement   ADL Overall ADL's : Modified independent                                             Vision Patient Visual Report: No change from baseline              Pertinent Vitals/Pain Pain Assessment: No/denies pain (But I am still pretty numb)     Hand Dominance Right   Extremity/Trunk Assessment Upper Extremity Assessment Upper Extremity Assessment: Overall WFL for tasks assessed           Communication Communication Communication: No difficulties   Cognition Arousal/Alertness: Awake/alert Behavior During Therapy: WFL for tasks  assessed/performed Overall Cognitive Status: Within Functional Limits for tasks assessed                                                Home Living Family/patient expects to be discharged to:: Private residence Living Arrangements: Spouse/significant other Available Help at Discharge: Family;Available 24 hours/day Type of Home: House Home Access: Stairs to enter [employer] of Steps: 1 Entrance Stairs-Rails: None Home Layout: One level     Bathroom Shower/Tub: Producer, television/film/video: Standard     Home Equipment: Cane - single point          Prior Functioning/Environment Level of Independence: Independent        Comments: Retired Designer, fashion/clothing. On disability        OT Problem List: Decreased range of motion      OT Treatment/Interventions:      OT Goals(Current goals can be found in the care plan  section) Acute Rehab OT Goals Patient Stated Goal: to get a rollator and go home tomorrow                AM-PAC OT "6 Clicks" Daily Activity     Outcome Measure Help from another person eating meals?: None Help from another person taking care of personal grooming?: None Help from another person toileting, which includes using toliet, bedpan, or urinal?: None Help from another person bathing (including washing, rinsing, drying)?: None Help from another person to put on and taking off regular upper body clothing?: None Help from another person to put on and taking off regular lower body clothing?: None 6 Click Score: 24   End of Session Nurse Communication:  (Pt needs a rollator)  Activity Tolerance: Patient tolerated treatment well Patient left: in bed;with call bell/phone within reach  OT Visit Diagnosis: Unsteadiness on feet (R26.81)                Time: 6283-1517 OT Time Calculation (min): 17 min Charges:  OT General Charges $OT Visit: 1 Visit OT Evaluation $OT Eval Moderate Complexity: 1 Mod  [name], OTR/L Acute [employer] Pager [phone] Office [phone]    [name] 02/27/2021, 5:56 PM

## 2021-02-27 NOTE — Op Note (Signed)
Providing Compassionate, Quality Care - Together  Date of service: 02/27/2021  PREOP DIAGNOSIS:  Left sacroiliitis  POSTOP DIAGNOSIS: Same  PROCEDURE: Left minimally invasive sacroiliac arthrodesis, SI bone iFuse implants (7.0 x 45 mm, 7.0 x 40 mm, 7.0 x 40 mm) Use of allograft, vesuvius Use of fluoroscopy less than 1 hour  SURGEON: Dr. Kendell Bane C. Deiondra Denley, DO  ASSISTANT: Dr. Marikay Alar, MD  ANESTHESIA: General Endotracheal  EBL: <10cc  SPECIMENS: none  DRAINS: none  COMPLICATIONS: none  CONDITION: Hemodynamically stable  HISTORY: Juan Long is a 45 y.o. male that presented with bilateral sacroiliitis.  His right side was worse than his left, he underwent a right sacroiliac fusion 4 months ago and is done well postoperatively.  He continued to have left SI pain that failed conservative measures, but he responded to diagnostic blocks.  We discussed surgical arthrodesis versus continue conservative measures, he elected undergo a left SI joint fusion.  We discussed all risks, benefits expected outcomes and he agreed to proceed.  PROCEDURE IN DETAIL: The patient was brought to the operating room. After induction of general anesthesia, the patient was positioned prone on the Speculator table. All pressure points were meticulously padded. Lateral fluoroscopy was used to mark the skin incision at the junction of the sacral ala and posterior sacral line on the patient's left side. This was prepped and draped in the usual sterile fashion. Physician driven timeout performed confirming the appropriate side and procedure and patient.   Using a 10 blade, the skin and soft tissue was incised sharply approximately 3 cm along the posterior sacral line on the patient's left side. Under lateral fluoroscopy, 3 pins were placed in the ileum approaching the sacrum using the manufactures 15 mm fixed guide. Inlet view was obtained and all pins were noted to be within the ileum approaching the mid  sacral body appropriately. Outlet view was obtained and the 3 pins were tapped just aimed superior to the S1 foramen for the superior pin, directed towards the S1 foramen for the middle pin and inferior to the S1 foramen for the inferior pin. All 3 were advanced across the SI joint appropriately. Soft tissue dilator was used to clear the pain free of fascia. Soft tissue dilator was placed over the superior pin, implant size was measured to be 45 mm, then the broach was used to cross the SI joint on the superior pin. The broach was removed and the pin was remained in the planned trajectory and confirmed with fluoroscopy. The selected implant was 45 mm which was covered with allograft and then placed under live fluoroscopy over the pin. The pin and soft tissue dilator were removed. The middle pin was dilated with a soft tissue dilator, implant size was measured to be 40 mm. The SI joint along this pin was broached. Marney Doctor was removed and the 40 mm implant was covered in allograft and placed under live fluoroscopy. The inferior pin was then dilated with the soft tissue dilator. Implant size was measured to be 44mm. The joint was broached and the broach was removed. The implant was then covered with allograft and placed under fluoroscopy. All pins were removed. Outlet view fluoroscopy was obtained and all implants were noted to be in appropriate placement without violation of any neuroforamina. Inlet view fluoroscopy was obtained and there was no anterior posterior breach noted. Lateral fluoroscopy was obtained and all implants were in appropriate placement. Hemostasis was achieved with Bovie cautery. The soft tissue and muscle was  injected with local anesthetic.  The wound was closed with 2-0 Vicryl sutures.  The skin was closed with Dermabond.  Sterile dressing was applied.  The drapes were taken down.   At the end of the case all sponge, needle, and instrument counts were correct. The patient was then transferred  to the stretcher, extubated, and taken to the post-anesthesia care unit in stable hemodynamic condition.

## 2021-02-27 NOTE — Transfer of Care (Signed)
Immediate Anesthesia Transfer of Care Note  Patient: Juan Long  Procedure(s) Performed: Left sacroiliac joint fusion (Left: Hip)  Patient Location: PACU  Anesthesia Type:General  Level of Consciousness: awake, alert  and oriented  Airway & Oxygen Therapy: Patient Spontanous Breathing and Patient connected to face mask oxygen  Post-op Assessment: Report given to RN and Post -op Vital signs reviewed and stable  Post vital signs: Reviewed and stable  Last Vitals:  Vitals Value Taken Time  BP 141/81 02/27/21 1215  Temp    Pulse 79 02/27/21 1216  Resp 17 02/27/21 1216  SpO2 100 % 02/27/21 1216  Vitals shown include unvalidated device data.  Last Pain:  Vitals:   02/27/21 0914  TempSrc:   PainSc: 8       Patients Stated Pain Goal: 3 (02/27/21 0914)  Complications: No notable events documented.

## 2021-02-27 NOTE — Addendum Note (Signed)
Addendum  created 02/27/21 1315 by Dorris Singh, MD   Order list changed, Order sets accessed, Pharmacy for encounter modified

## 2021-02-27 NOTE — Anesthesia Preprocedure Evaluation (Addendum)
Anesthesia Evaluation  Patient identified by MRN, date of birth, ID band Patient awake    Reviewed: Allergy & Precautions, H&P , NPO status , Patient's Chart, lab work & pertinent test results  Airway Mallampati: III  TM Distance: >3 FB Neck ROM: Full    Dental no notable dental hx. (+) Teeth Intact, Dental Advisory Given   Pulmonary sleep apnea and Continuous Positive Airway Pressure Ventilation , former smoker,    Pulmonary exam normal breath sounds clear to auscultation       Cardiovascular hypertension,  Rhythm:Regular Rate:Normal     Neuro/Psych Depression negative neurological ROS     GI/Hepatic Neg liver ROS, GERD  ,  Endo/Other  Morbid obesity  Renal/GU negative Renal ROS  negative genitourinary   Musculoskeletal  (+) Arthritis , Osteoarthritis,    Abdominal   Peds  Hematology negative hematology ROS (+)   Anesthesia Other Findings   Reproductive/Obstetrics negative OB ROS                            Anesthesia Physical Anesthesia Plan  ASA: 3  Anesthesia Plan: General   Post-op Pain Management:    Induction: Intravenous  PONV Risk Score and Plan: 3 and Ondansetron, Dexamethasone and Midazolam  Airway Management Planned: Oral ETT  Additional Equipment:   Intra-op Plan:   Post-operative Plan: Extubation in OR  Informed Consent: I have reviewed the patients History and Physical, chart, labs and discussed the procedure including the risks, benefits and alternatives for the proposed anesthesia with the patient or authorized representative who has indicated his/her understanding and acceptance.     Dental advisory given  Plan Discussed with: CRNA  Anesthesia Plan Comments:         Anesthesia Quick Evaluation

## 2021-02-27 NOTE — Progress Notes (Signed)
   Providing Compassionate, Quality Care - Together  NEUROSURGERY PROGRESS NOTE   S: Patient seen and examined in PACU, denies leg pain.  Has periincisional pain.  O: EXAM:  BP 108/71 (BP Location: Left Arm)   Pulse 77   Temp (!) 97.1 F (36.2 C)   Resp 15   Ht 5\' 11"  (1.803 m)   Wt 119.7 kg   SpO2 99%   BMI 36.82 kg/m   Awake, alert, oriented  PERRLA EOMI Speech fluent, appropriate  CNs grossly intact  5/5 BUE/BLE  Incision clean dry and intact  ASSESSMENT:  45 y.o. male with   Left sacroiliitis  -Status post left SI fusion on 02/27/2021  PLAN: -PT/OT, eval for walker -Pain control -Updated the patient's wife -DC planning tomorrow    Thank you for allowing me to participate in this patient's care.  Please do not hesitate to call with questions or concerns.   04/29/2021, DO Neurosurgeon [facility] Cell: [phone]

## 2021-02-27 NOTE — Anesthesia Postprocedure Evaluation (Signed)
Anesthesia Post Note  Patient: Juan Long  Procedure(s) Performed: Left sacroiliac joint fusion (Left: Hip)     Patient location during evaluation: PACU Anesthesia Type: General Level of consciousness: awake and alert Pain management: pain level controlled Vital Signs Assessment: post-procedure vital signs reviewed and stable Respiratory status: spontaneous breathing, nonlabored ventilation and respiratory function stable Cardiovascular status: blood pressure returned to baseline and stable Postop Assessment: no apparent nausea or vomiting Anesthetic complications: no   No notable events documented.  Last Vitals:  Vitals:   02/27/21 1230 02/27/21 1245  BP: 108/71 103/66  Pulse: 77 67  Resp: 15 12  Temp:    SpO2: 99% 95%    Last Pain:  Vitals:   02/27/21 1245  TempSrc:   PainSc: Asleep                 Tara Rud,W. EDMOND

## 2021-02-27 NOTE — H&P (Signed)
Providing Compassionate, Quality Care - Together  NEUROSURGERY HISTORY & PHYSICAL   Juan Long is an 46 y.o. male.   Chief Complaint: Left sacroiliitis HPI: This is a pleasant 45 year old male with a history of L4-S1 fusion in the right SI fusion that complains of left sacroiliitis.  He failed conservative measures including injections and physical therapy.  He presents today for left SI fusion.  He continues to have significant left SI pain that is causing him difficulty walking as well as sleeping.  It is altered his lifestyle.  He also has radiating pain down his left leg in a radicular pattern.  Past Medical History:  Diagnosis Date   Arthritis    Depression    Esophageal reflux    Essential hypertension, benign    many years ago   Obstructive sleep apnea (adult) (pediatric)    OSA on CPAP    Spondylisthesis     Past Surgical History:  Procedure Laterality Date   ANTERIOR CERVICAL DECOMP/DISCECTOMY FUSION Right 01/04/2018   Procedure: ANTERIOR CERVICAL DECOMPRESSION FUSION CERVICAL FIVE-SIX, CERVICAL SIX-SEVEN WITH INSTRUMENTATION AND ALLOGRAFT;  Surgeon: Estill Bamberg, MD;  Location: [facility] OR;  Service: Orthopedics;  Laterality: Right;   BACK SURGERY     CERVICAL SPINE SURGERY     LUMBAR LAMINECTOMY/DECOMPRESSION MICRODISCECTOMY Right 11/04/2020   Procedure: SI Joint Fusion;  Surgeon: Bethann Goo, DO;  Location: [facility] OR;  Service: Neurosurgery;  Laterality: Right;   MULTIPLE TOOTH EXTRACTIONS     NASAL SEPTUM SURGERY     SHOULDER ARTHROSCOPY Right 2019   TONSILLECTOMY     uvula shaved     VARICOCELE EXCISION     WISDOM TOOTH EXTRACTION      Family History  Problem Relation Age of Onset   Heart attack Father    Social History:  reports that he has quit smoking. His smoking use included cigarettes. He has quit using smokeless tobacco. He reports current alcohol use. He reports that he does not use drugs.  Allergies: No Known Allergies  Medications Prior to  Admission  Medication Sig Dispense Refill   busPIRone (BUSPAR) 10 MG tablet Take 10 mg by mouth 2 (two) times daily.     Carboxymethylcellulose Sodium (LUBRICANT EYE DROPS OP) Place 1 drop into both eyes 3 (three) times daily as needed (dry eyes).     cyclobenzaprine (FLEXERIL) 10 MG tablet Take 1 tablet (10 mg total) by mouth 3 (three) times daily as needed for muscle spasms. 90 tablet 1   FLUoxetine (PROZAC) 20 MG tablet Take 20 mg by mouth 3 (three) times daily.     HYDROcodone-acetaminophen (NORCO) 10-325 MG tablet Take 1 tablet by mouth every 6 (six) hours as needed for moderate pain ((score 4 to 6)). 60 tablet 0   traMADol (ULTRAM) 50 MG tablet Take 1 tablet (50 mg total) by mouth every 6 (six) hours as needed. 60 tablet 0   aspirin EC 81 MG tablet Take 1 tablet (81 mg total) by mouth daily. (Patient not taking: No sig reported) 30 tablet 11   Cholecalciferol (VITAMIN D3) 50 MCG (2000 UT) TABS Take 2,000 Units by mouth daily.     vitamin E 180 MG (400 UNITS) capsule Take 400 Units by mouth daily.      No results found for this or any previous visit (from the past 48 hour(s)). No results found.  ROS 14 point review of systems performed, all pertinent positives negatives listed in HPI above  Blood pressure 134/75, pulse  73, temperature 98.6 F (37 C), temperature source Oral, resp. rate 18, height 5\' 11"  (1.803 m), weight 119.7 kg, SpO2 98 %. Physical Exam  A&O x3 PERRLA Cranial nerves II through XII intact Full strength throughout all extremities Sensory intact light touch Left SI tenderness Positive left  Assessment/Plan 46 year old male with  Left sacroiliitis -OR today for left SI fusion All risks, benefits and expected outcomes discussed with the patient, he agrees to proceed.   Thank you for allowing me to participate in this patient's care.  Please do not hesitate to call with questions or concerns.   48, DO Neurosurgeon [facility] Cell: [phone]

## 2021-02-27 NOTE — Anesthesia Procedure Notes (Addendum)
Procedure Name: Intubation Date/Time: 02/27/2021 10:55 AM Performed by: Bryson Corona, CRNA Pre-anesthesia Checklist: Patient identified, Emergency Drugs available, Suction available and Patient being monitored Patient Re-evaluated:Patient Re-evaluated prior to induction Oxygen Delivery Method: Circle System Utilized Preoxygenation: Pre-oxygenation with 100% oxygen Induction Type: IV induction Ventilation: Two handed mask ventilation required and Oral airway inserted - appropriate to patient size Laryngoscope Size: Mac and 4 Grade View: Grade II Tube type: Oral Tube size: 7.0 mm Number of attempts: 1 Airway Equipment and Method: Stylet and Oral airway Placement Confirmation: ETT inserted through vocal cords under direct vision, positive ETCO2 and breath sounds checked- equal and bilateral Secured at: 22 cm Tube secured with: Tape Dental Injury: Teeth and Oropharynx as per pre-operative assessment

## 2021-02-28 DIAGNOSIS — M461 Sacroiliitis, not elsewhere classified: Secondary | ICD-10-CM | POA: Diagnosis not present

## 2021-02-28 MED ORDER — HYDROCODONE-ACETAMINOPHEN 10-325 MG PO TABS: 1.0000 | ORAL_TABLET | ORAL | 0 refills | Status: DC | PRN

## 2021-02-28 NOTE — Plan of Care (Signed)
Adequately ready for Discharged

## 2021-02-28 NOTE — Progress Notes (Signed)
PT Cancellation Note  Patient Details Name: MANNIE OHLIN MRN: [id number] DOB: [birth year]   Cancelled Treatment:    Reason Eval/Treat Not Completed: PT screened, no needs identified, will sign off. Based on conversation with OT and discussion with RN, no acute PT needs. Please re-consult if change in status.   Deland Pretty, DPT   Acute Rehabilitation Department Pager #: [phone]   Gaetana Michaelis 02/28/2021, 7:36 AM

## 2021-02-28 NOTE — Progress Notes (Signed)
Patient alert and oriented, voiding adequately, skin clean, dry and intact without evidence of skin break down, or symptoms of complications - no redness or edema noted, only slight tenderness at site.  Pain  medication given at time of discharge. Patient has an appointment with MD in 3 weeks

## 2021-02-28 NOTE — Discharge Summary (Signed)
Physician Discharge Summary  Patient ID: Juan Long MRN: [id number] DOB/AGE: 12-12-1975 45 y.o.  Admit date: 02/27/2021 Discharge date: 02/28/2021  Admission Diagnoses:  Discharge Diagnoses:  Active Problems:   Sacroiliitis [facility])   Discharged Condition: good  Hospital Course: Patient into the hospital where he underwent uncomplicated sacroiliac joint fusion for treatment of his sacroiliitis.  Postoperatively doing well.  Pain well controlled.  No new neurologic symptoms.  No wound issues.  Ambulating without difficulty.  Voiding well.  Plan for discharge home.  Consults:   Significant Diagnostic Studies:   Treatments:   Discharge Exam: Blood pressure 136/76, pulse 75, temperature (!) 97.4 F (36.3 C), temperature source Oral, resp. rate 19, height 5\' 11"  (1.803 m), weight 119.7 kg, SpO2 98 %. Awake and alert.  Oriented and appropriate.  Motor and sensory function intact.  Wound clean and dry.  Chest and abdomen benign.  Disposition: Discharge disposition: 01-Home or Self Care        Allergies as of 02/28/2021   No Known Allergies      Medication List     TAKE these medications    busPIRone 10 MG tablet Commonly known as: BUSPAR Take 10 mg by mouth 2 (two) times daily.   cyclobenzaprine 10 MG tablet Commonly known as: FLEXERIL Take 1 tablet (10 mg total) by mouth 3 (three) times daily as needed for muscle spasms.   FLUoxetine 20 MG tablet Commonly known as: PROZAC Take 20 mg by mouth 3 (three) times daily.   HYDROcodone-acetaminophen 10-325 MG tablet Commonly known as: NORCO Take 1 tablet by mouth every 6 (six) hours as needed for moderate pain ((score 4 to 6)). What changed: Another medication with the same name was added. Make sure you understand how and when to take each.   HYDROcodone-acetaminophen 10-325 MG tablet Commonly known as: NORCO Take 1 tablet by mouth every 4 (four) hours as needed for moderate pain ((score 4 to 6)). What changed:  You were already taking a medication with the same name, and this prescription was added. Make sure you understand how and when to take each.   LUBRICANT EYE DROPS OP Place 1 drop into both eyes 3 (three) times daily as needed (dry eyes).   traMADol 50 MG tablet Commonly known as: Ultram Take 1 tablet (50 mg total) by mouth every 6 (six) hours as needed.   Vitamin D3 50 MCG (2000 UT) Tabs Take 2,000 Units by mouth daily.   vitamin E 180 MG (400 UNITS) capsule Take 400 Units by mouth daily.       ASK your doctor about these medications    aspirin EC 81 MG tablet Take 1 tablet (81 mg total) by mouth daily.         Signed: 04/30/2021 Yliana Gravois 02/28/2021, 10:00 AM

## 2021-02-28 NOTE — Discharge Instructions (Addendum)
Wound Care Keep incision covered and dry for two days.   Do not put any creams, lotions, or ointments on incision. Leave steri-strips on hip.  They will fall off by themselves. Activity Walk each and every day, increasing distance each day. No lifting greater than 5 lbs.  Avoid excessive neck motion. No driving for 2 weeks; may ride as a passenger locally.  Diet Resume your normal diet.  Return to Work Will be discussed at you follow up appointment. Call Your Doctor If Any of These Occur Redness, drainage, or swelling at the wound.  Temperature greater than 101 degrees. Severe pain not relieved by pain medication. Incision starts to come apart. Follow Up Appt Call today for appointment in 1-2 weeks ([phone]) or for problems.  If you have any hardware placed in your spine, you will need an x-ray before your appointment.

## 2021-03-03 ENCOUNTER — Encounter (HOSPITAL_COMMUNITY): Payer: Self-pay | Admitting: Neurological Surgery

## 2021-06-01 ENCOUNTER — Other Ambulatory Visit: Payer: Self-pay | Admitting: Neurological Surgery

## 2021-06-01 ENCOUNTER — Other Ambulatory Visit (HOSPITAL_COMMUNITY): Payer: Self-pay | Admitting: Neurological Surgery

## 2021-06-01 DIAGNOSIS — M461 Sacroiliitis, not elsewhere classified: Secondary | ICD-10-CM

## 2021-06-09 ENCOUNTER — Ambulatory Visit (HOSPITAL_COMMUNITY)
Admission: RE | Admit: 2021-06-09 | Discharge: 2021-06-09 | Disposition: A | Discharge status: Home / Self Care | Payer: No Typology Code available for payment source | Source: Ambulatory Visit | Attending: Neurological Surgery | Admitting: Neurological Surgery

## 2021-06-09 ENCOUNTER — Other Ambulatory Visit: Payer: Self-pay

## 2021-06-09 DIAGNOSIS — M461 Sacroiliitis, not elsewhere classified: Secondary | ICD-10-CM | POA: Insufficient documentation

## 2021-07-03 ENCOUNTER — Other Ambulatory Visit: Payer: Self-pay | Admitting: Neurological Surgery

## 2021-07-15 NOTE — Pre-Procedure Instructions (Addendum)
Surgical Instructions    Your procedure is scheduled on Monday, October 31st.  Report to [facility] Main Entrance "A" at 5:30 A.M., then check in with the Admitting office.  Call this number if you have problems the morning of surgery:  [phone]   If you have any questions prior to your surgery date call [phone]: Open Monday-Friday 8am-4pm    Remember:  Do not eat or drink after midnight the night before your surgery   Take these medicines the morning of surgery with A SIP OF WATER  buPROPion (WELLBUTRIN SR) Eye drops FLUoxetine (PROZAC) gabapentin (NEURONTIN) HYDROcodone-acetaminophen (NORCO)-as needed for pain.   Follow your surgeon's instructions on when to stop Aspirin.  If no instructions were given by your surgeon then you will need to call the office to get those instructions.    As of today, STOP taking any Aspirin (unless otherwise instructed by your surgeon) Aleve, Naproxen, Ibuprofen, Motrin, Advil, Goody's, BC's, all herbal medications, fish oil, and all vitamins.                     Do NOT Smoke (Tobacco/Vaping) or drink Alcohol 24 hours prior to your procedure.  If you use a CPAP at night, you may bring all equipment for your overnight stay.   Contacts, glasses, piercing's, hearing aid's, dentures or partials may not be worn into surgery, please bring cases for these belongings.    For patients admitted to the hospital, discharge time will be determined by your treatment team.   Patients discharged the day of surgery will not be allowed to drive home, and someone needs to stay with them for 24 hours.  NO VISITORS WILL BE ALLOWED IN PRE-OP WHERE PATIENTS GET READY FOR SURGERY.  ONLY 1 SUPPORT PERSON MAY BE PRESENT IN THE WAITING ROOM WHILE YOU ARE IN SURGERY.  IF YOU ARE TO BE ADMITTED, ONCE YOU ARE IN YOUR ROOM YOU WILL BE ALLOWED TWO (2) VISITORS.  Minor children may have two parents present. Special consideration for safety and communication needs will be  reviewed on a case by case basis.   Special instructions:   [place]- Preparing For Surgery  Before surgery, you can play an important role. Because skin is not sterile, your skin needs to be as free of germs as possible. You can reduce the number of germs on your skin by washing with CHG (chlorahexidine gluconate) Soap before surgery.  CHG is an antiseptic cleaner which kills germs and bonds with the skin to continue killing germs even after washing.    Oral Hygiene is also important to reduce your risk of infection.  Remember - BRUSH YOUR TEETH THE MORNING OF SURGERY WITH YOUR REGULAR TOOTHPASTE  Please do not use if you have an allergy to CHG or antibacterial soaps. If your skin becomes reddened/irritated stop using the CHG.  Do not shave (including legs and underarms) for at least 48 hours prior to first CHG shower. It is OK to shave your face.  Please follow these instructions carefully.   Shower the NIGHT BEFORE SURGERY and the MORNING OF SURGERY  If you chose to wash your hair, wash your hair first as usual with your normal shampoo.  After you shampoo, rinse your hair and body thoroughly to remove the shampoo.  Use CHG Soap as you would any other liquid soap. You can apply CHG directly to the skin and wash gently with a scrungie or a clean washcloth.   Apply the CHG Soap  to your body ONLY FROM THE NECK DOWN.  Do not use on open wounds or open sores. Avoid contact with your eyes, ears, mouth and genitals (private parts). Wash Face and genitals (private parts)  with your normal soap.   Wash thoroughly, paying special attention to the area where your surgery will be performed.  Thoroughly rinse your body with warm water from the neck down.  DO NOT shower/wash with your normal soap after using and rinsing off the CHG Soap.  Pat yourself dry with a CLEAN TOWEL.  Wear CLEAN PAJAMAS to bed the night before surgery  Place CLEAN SHEETS on your bed the night before your  surgery  DO NOT SLEEP WITH PETS.   Day of Surgery: Shower with CHG soap. Do not wear jewelry. Do not wear lotions, powders, colognes, or deodorant. Do not shave 48 hours prior to surgery.  Men may shave face and neck. Do not bring valuables to the hospital. [facility] is not responsible for any belongings or valuables. Wear Clean/Comfortable clothing the morning of surgery Remember to brush your teeth WITH YOUR REGULAR TOOTHPASTE.   Please read over the following fact sheets that you were given.   3 days prior to your procedure or After your COVID test   You are not required to quarantine however you are required to wear a well-fitting mask when you are out and around people not in your household. If your mask becomes wet or soiled, replace with a new one.   Wash your hands often with soap and water for 20 seconds or clean your hands with an alcohol-based hand sanitizer that contains at least 60% alcohol.   Do not share personal items.   Notify your provider:  o if you are in close contact with someone who has COVID  o or if you develop a fever of 100.4 or greater, sneezing, cough, sore throat, shortness of breath or body aches.

## 2021-07-16 ENCOUNTER — Encounter (HOSPITAL_COMMUNITY)
Admission: RE | Admit: 2021-07-16 | Discharge: 2021-07-16 | Disposition: A | Discharge status: Home / Self Care | Payer: No Typology Code available for payment source | Source: Ambulatory Visit | Attending: Neurological Surgery | Admitting: Neurological Surgery

## 2021-07-16 ENCOUNTER — Encounter (HOSPITAL_COMMUNITY): Payer: Self-pay

## 2021-07-16 ENCOUNTER — Other Ambulatory Visit: Payer: Self-pay

## 2021-07-16 VITALS — BP 140/90 | HR 72 | Temp 98.2°F | Resp 18

## 2021-07-16 DIAGNOSIS — Z01818 Encounter for other preprocedural examination: Secondary | ICD-10-CM

## 2021-07-16 DIAGNOSIS — M461 Sacroiliitis, not elsewhere classified: Secondary | ICD-10-CM

## 2021-07-16 DIAGNOSIS — Z981 Arthrodesis status: Secondary | ICD-10-CM

## 2021-07-16 DIAGNOSIS — Z01812 Encounter for preprocedural laboratory examination: Secondary | ICD-10-CM | POA: Diagnosis not present

## 2021-07-16 DIAGNOSIS — Z20822 Contact with and (suspected) exposure to covid-19: Secondary | ICD-10-CM | POA: Insufficient documentation

## 2021-07-16 DIAGNOSIS — M5416 Radiculopathy, lumbar region: Secondary | ICD-10-CM

## 2021-07-16 LAB — SURGICAL PCR SCREEN
MRSA, PCR: NEGATIVE
Staphylococcus aureus: POSITIVE — AB

## 2021-07-16 LAB — BASIC METABOLIC PANEL
Anion gap: 8 (ref 5–15)
BUN: 10 mg/dL (ref 6–20)
CO2: 24 mmol/L (ref 22–32)
Calcium: 9.4 mg/dL (ref 8.9–10.3)
Chloride: 106 mmol/L (ref 98–111)
Creatinine, Ser: 0.84 mg/dL (ref 0.61–1.24)
GFR, Estimated: >60 mL/min (ref >60)
Glucose, Bld: 100 mg/dL — ABNORMAL HIGH (ref 70–99)
Potassium: 4 mmol/L (ref 3.5–5.1)
Sodium: 138 mmol/L (ref 135–145)

## 2021-07-16 LAB — CBC
HCT: 43.7 % (ref 39.0–52.0)
Hemoglobin: 14 g/dL (ref 13.0–17.0)
MCH: 29.2 pg (ref 26.0–34.0)
MCHC: 32 g/dL (ref 30.0–36.0)
MCV: 91 fL (ref 80.0–100.0)
Platelets: 432 10*3/uL — ABNORMAL HIGH (ref 150–400)
RBC: 4.8 MIL/uL (ref 4.22–5.81)
RDW: 14.3 % (ref 11.5–15.5)
WBC: 6.5 10*3/uL (ref 4.0–10.5)
nRBC: 0 % (ref 0.0–0.2)

## 2021-07-16 LAB — PROTIME-INR
INR: 0.9 (ref 0.8–1.2)
Prothrombin Time: 12.1 seconds (ref 11.4–15.2)

## 2021-07-16 LAB — SARS CORONAVIRUS 2 (TAT 6-24 HRS): SARS Coronavirus 2: NEGATIVE

## 2021-07-16 NOTE — Progress Notes (Signed)
PCP - [facility] [place] Cardiologist - denies  PPM/ICD - denies Device Orders - n/a Rep Notified - n/a  Chest x-ray - n/a EKG - 02/17/2021 Stress Test - denies ECHO - denies Cardiac Cath - denies  Sleep Study - yes  BPAP - at night  Fasting Blood Sugar - n/a   Blood Thinner Instructions: n/a  Aspirin Instructions: Aspirin - patient stopped taking Aspirin more than 2 weeks  Patient was instructed: As of today, STOP taking any Aspirin (unless otherwise instructed by your surgeon) Aleve, Naproxen, Ibuprofen, Motrin, Advil, Goody's, BC's, all herbal medications, fish oil, and all vitamins.  ERAS Protcol - n/a  COVID TEST- yes, done in PAT on 07/16/2021   Anesthesia review: no  Patient denies shortness of breath, fever, cough and chest pain at PAT appointment   All instructions explained to the patient, with a verbal understanding of the material. Patient agrees to go over the instructions while at home for a better understanding. Patient also instructed to self quarantine after being tested for COVID-19. The opportunity to ask questions was provided.

## 2021-07-17 LAB — TYPE AND SCREEN
ABO/RH(D): O POS
Antibody Screen: NEGATIVE

## 2021-07-19 NOTE — Anesthesia Preprocedure Evaluation (Addendum)
Anesthesia Evaluation  Patient identified by MRN, date of birth, ID band Patient awake    Reviewed: Allergy & Precautions, H&P , NPO status , Patient's Chart, lab work & pertinent test results  Airway Mallampati: III  TM Distance: >3 FB Neck ROM: Full    Dental no notable dental hx. (+) Teeth Intact, Dental Advisory Given   Pulmonary sleep apnea and Continuous Positive Airway Pressure Ventilation , former smoker,    Pulmonary exam normal breath sounds clear to auscultation       Cardiovascular Exercise Tolerance: Good hypertension, Pt. on medications  Rhythm:Regular Rate:Normal     Neuro/Psych Depression negative neurological ROS     GI/Hepatic Neg liver ROS, GERD  Medicated,  Endo/Other  negative endocrine ROS  Renal/GU negative Renal ROS  negative genitourinary   Musculoskeletal  (+) Arthritis , Osteoarthritis,    Abdominal   Peds  Hematology negative hematology ROS (+)   Anesthesia Other Findings   Reproductive/Obstetrics negative OB ROS                            Anesthesia Physical Anesthesia Plan  ASA: 3  Anesthesia Plan: General   Post-op Pain Management:    Induction: Intravenous  PONV Risk Score and Plan: 3 and Dexamethasone and Midazolam  Airway Management Planned: Oral ETT  Additional Equipment:   Intra-op Plan:   Post-operative Plan: Extubation in OR  Informed Consent: I have reviewed the patients History and Physical, chart, labs and discussed the procedure including the risks, benefits and alternatives for the proposed anesthesia with the patient or authorized representative who has indicated his/her understanding and acceptance.     Dental advisory given  Plan Discussed with: CRNA  Anesthesia Plan Comments:        Anesthesia Quick Evaluation

## 2021-07-20 ENCOUNTER — Inpatient Hospital Stay (HOSPITAL_COMMUNITY): Payer: No Typology Code available for payment source | Admitting: Anesthesiology

## 2021-07-20 ENCOUNTER — Encounter (HOSPITAL_COMMUNITY)
Admission: RE | Disposition: A | Discharge status: Home / Self Care | Payer: Self-pay | Source: Home / Self Care | Attending: Neurological Surgery

## 2021-07-20 ENCOUNTER — Encounter (HOSPITAL_COMMUNITY): Payer: Self-pay | Admitting: Neurological Surgery

## 2021-07-20 ENCOUNTER — Inpatient Hospital Stay (HOSPITAL_COMMUNITY)
Admission: RE | Admit: 2021-07-20 | Discharge: 2021-07-21 | DRG: 455 | Disposition: A | Discharge status: Home / Self Care | Payer: No Typology Code available for payment source | Attending: Neurological Surgery | Admitting: Neurological Surgery

## 2021-07-20 ENCOUNTER — Other Ambulatory Visit: Payer: Self-pay

## 2021-07-20 ENCOUNTER — Inpatient Hospital Stay (HOSPITAL_COMMUNITY): Payer: No Typology Code available for payment source

## 2021-07-20 DIAGNOSIS — M5126 Other intervertebral disc displacement, lumbar region: Secondary | ICD-10-CM | POA: Diagnosis not present

## 2021-07-20 DIAGNOSIS — Z419 Encounter for procedure for purposes other than remedying health state, unspecified: Secondary | ICD-10-CM

## 2021-07-20 DIAGNOSIS — Z888 Allergy status to other drugs, medicaments and biological substances status: Secondary | ICD-10-CM | POA: Diagnosis not present

## 2021-07-20 DIAGNOSIS — Z20822 Contact with and (suspected) exposure to covid-19: Secondary | ICD-10-CM | POA: Diagnosis not present

## 2021-07-20 DIAGNOSIS — M48061 Spinal stenosis, lumbar region without neurogenic claudication: Principal | ICD-10-CM | POA: Diagnosis present

## 2021-07-20 DIAGNOSIS — Z87891 Personal history of nicotine dependence: Secondary | ICD-10-CM | POA: Diagnosis not present

## 2021-07-20 DIAGNOSIS — Z981 Arthrodesis status: Secondary | ICD-10-CM | POA: Diagnosis not present

## 2021-07-20 DIAGNOSIS — Z79899 Other long term (current) drug therapy: Secondary | ICD-10-CM | POA: Diagnosis not present

## 2021-07-20 DIAGNOSIS — Z7982 Long term (current) use of aspirin: Secondary | ICD-10-CM | POA: Diagnosis not present

## 2021-07-20 DIAGNOSIS — K219 Gastro-esophageal reflux disease without esophagitis: Secondary | ICD-10-CM | POA: Diagnosis present

## 2021-07-20 DIAGNOSIS — G4733 Obstructive sleep apnea (adult) (pediatric): Secondary | ICD-10-CM | POA: Diagnosis not present

## 2021-07-20 DIAGNOSIS — I10 Essential (primary) hypertension: Secondary | ICD-10-CM | POA: Diagnosis not present

## 2021-07-20 DIAGNOSIS — F32A Depression, unspecified: Secondary | ICD-10-CM | POA: Diagnosis present

## 2021-07-20 DIAGNOSIS — M1990 Unspecified osteoarthritis, unspecified site: Secondary | ICD-10-CM | POA: Diagnosis not present

## 2021-07-20 DIAGNOSIS — Z8249 Family history of ischemic heart disease and other diseases of the circulatory system: Secondary | ICD-10-CM

## 2021-07-20 SURGERY — POSTERIOR LUMBAR FUSION 1 WITH HARDWARE REMOVAL
Anesthesia: General | Site: Back

## 2021-07-20 MED ORDER — METHOCARBAMOL 500 MG PO TABS
500.0000 mg | ORAL_TABLET | Freq: Four times a day (QID) | ORAL | Status: DC | PRN
  Administered 2021-07-20 – 2021-07-21 (×3): 500 mg via ORAL
  Filled 2021-07-20 (×3): qty 1

## 2021-07-20 MED ORDER — HYDROCODONE-ACETAMINOPHEN 10-325 MG PO TABS
1.0000 | ORAL_TABLET | ORAL | Status: DC | PRN
  Administered 2021-07-20 – 2021-07-21 (×5): 1 via ORAL
  Filled 2021-07-20 (×5): qty 1

## 2021-07-20 MED ORDER — GABAPENTIN 300 MG PO CAPS
300.0000 mg | ORAL_CAPSULE | ORAL | Status: DC
  Filled 2021-07-20: qty 1

## 2021-07-20 MED ORDER — SODIUM CHLORIDE 0.9% FLUSH: 3.0000 mL | INTRAVENOUS | Status: DC | PRN

## 2021-07-20 MED ORDER — GABAPENTIN 300 MG PO CAPS: 300.0000 mg | ORAL_CAPSULE | ORAL | Status: DC

## 2021-07-20 MED ORDER — FENTANYL CITRATE (PF) 100 MCG/2ML IJ SOLN
INTRAMUSCULAR | Status: DC | PRN
  Administered 2021-07-20: 100 ug via INTRAVENOUS

## 2021-07-20 MED ORDER — ACETAMINOPHEN 500 MG PO TABS
1000.0000 mg | ORAL_TABLET | Freq: Once | ORAL | Status: AC
  Administered 2021-07-20: 500 mg via ORAL
  Filled 2021-07-20: qty 2

## 2021-07-20 MED ORDER — HYDROMORPHONE HCL 1 MG/ML IJ SOLN: 0.5000 mg | INTRAMUSCULAR | Status: DC | PRN

## 2021-07-20 MED ORDER — HYDROMORPHONE HCL 1 MG/ML IJ SOLN: 0.2500 mg | INTRAMUSCULAR | Status: DC | PRN

## 2021-07-20 MED ORDER — THROMBIN 20000 UNITS EX SOLR
CUTANEOUS | Status: AC
  Filled 2021-07-20: qty 20000

## 2021-07-20 MED ORDER — PROPOFOL 10 MG/ML IV BOLUS
INTRAVENOUS | Status: AC
  Filled 2021-07-20: qty 20

## 2021-07-20 MED ORDER — FENTANYL CITRATE (PF) 250 MCG/5ML IJ SOLN
INTRAMUSCULAR | Status: AC
  Filled 2021-07-20: qty 5

## 2021-07-20 MED ORDER — VANCOMYCIN HCL 1000 MG IV SOLR
INTRAVENOUS | Status: DC | PRN
  Administered 2021-07-20: 1000 mg via TOPICAL

## 2021-07-20 MED ORDER — ROCURONIUM BROMIDE 10 MG/ML (PF) SYRINGE
PREFILLED_SYRINGE | INTRAVENOUS | Status: DC | PRN
Start: 2021-07-20 — End: 2021-07-20
  Administered 2021-07-20: 30 mg via INTRAVENOUS
  Administered 2021-07-20: 70 mg via INTRAVENOUS
  Administered 2021-07-20: 30 mg via INTRAVENOUS

## 2021-07-20 MED ORDER — HYDROMORPHONE HCL 1 MG/ML IJ SOLN
INTRAMUSCULAR | Status: DC | PRN
  Administered 2021-07-20 (×2): .5 mg via INTRAVENOUS

## 2021-07-20 MED ORDER — BUPROPION HCL ER (SR) 150 MG PO TB12: 150.0000 mg | ORAL_TABLET | Freq: Every day | ORAL | Status: DC

## 2021-07-20 MED ORDER — CHLORHEXIDINE GLUCONATE CLOTH 2 % EX PADS: 6.0000 | MEDICATED_PAD | Freq: Once | CUTANEOUS | Status: DC

## 2021-07-20 MED ORDER — MIDAZOLAM HCL 2 MG/2ML IJ SOLN
INTRAMUSCULAR | Status: AC
  Filled 2021-07-20: qty 2

## 2021-07-20 MED ORDER — CELECOXIB 200 MG PO CAPS
200.0000 mg | ORAL_CAPSULE | Freq: Two times a day (BID) | ORAL | Status: DC
  Administered 2021-07-20 – 2021-07-21 (×3): 200 mg via ORAL
  Filled 2021-07-20 (×3): qty 1

## 2021-07-20 MED ORDER — THROMBIN 5000 UNITS EX SOLR
CUTANEOUS | Status: AC
  Filled 2021-07-20: qty 5000

## 2021-07-20 MED ORDER — CEFAZOLIN SODIUM-DEXTROSE 2-4 GM/100ML-% IV SOLN
2.0000 g | INTRAVENOUS | Status: AC
  Administered 2021-07-20: 2 g via INTRAVENOUS
  Filled 2021-07-20: qty 100

## 2021-07-20 MED ORDER — POTASSIUM CHLORIDE IN NACL 20-0.9 MEQ/L-% IV SOLN: INTRAVENOUS | Status: DC

## 2021-07-20 MED ORDER — ACETAMINOPHEN 325 MG PO TABS: 650.0000 mg | ORAL_TABLET | ORAL | Status: DC | PRN

## 2021-07-20 MED ORDER — ORAL CARE MOUTH RINSE: 15.0000 mL | Freq: Once | OROMUCOSAL | Status: AC

## 2021-07-20 MED ORDER — DEXAMETHASONE SODIUM PHOSPHATE 10 MG/ML IJ SOLN
INTRAMUSCULAR | Status: DC | PRN
  Administered 2021-07-20: 10 mg via INTRAVENOUS

## 2021-07-20 MED ORDER — ONDANSETRON HCL 4 MG PO TABS: 4.0000 mg | ORAL_TABLET | Freq: Four times a day (QID) | ORAL | Status: DC | PRN

## 2021-07-20 MED ORDER — DEXAMETHASONE SODIUM PHOSPHATE 10 MG/ML IJ SOLN
10.0000 mg | Freq: Once | INTRAMUSCULAR | Status: DC
  Filled 2021-07-20: qty 1

## 2021-07-20 MED ORDER — THROMBIN 5000 UNITS EX SOLR: OROMUCOSAL | Status: DC | PRN

## 2021-07-20 MED ORDER — SODIUM CHLORIDE 0.9 % IV SOLN
250.0000 mL | INTRAVENOUS | Status: DC
  Administered 2021-07-20: 250 mL via INTRAVENOUS

## 2021-07-20 MED ORDER — 0.9 % SODIUM CHLORIDE (POUR BTL) OPTIME
TOPICAL | Status: DC | PRN
  Administered 2021-07-20: 1000 mL

## 2021-07-20 MED ORDER — DEXAMETHASONE SODIUM PHOSPHATE 4 MG/ML IJ SOLN
4.0000 mg | Freq: Four times a day (QID) | INTRAMUSCULAR | Status: DC
  Administered 2021-07-20 – 2021-07-21 (×2): 4 mg via INTRAVENOUS
  Filled 2021-07-20 (×3): qty 1

## 2021-07-20 MED ORDER — VANCOMYCIN HCL 1000 MG IV SOLR
INTRAVENOUS | Status: AC
  Filled 2021-07-20: qty 20

## 2021-07-20 MED ORDER — KETAMINE HCL 50 MG/5ML IJ SOSY
PREFILLED_SYRINGE | INTRAMUSCULAR | Status: AC
  Filled 2021-07-20: qty 5

## 2021-07-20 MED ORDER — CHLORHEXIDINE GLUCONATE 0.12 % MT SOLN
15.0000 mL | Freq: Once | OROMUCOSAL | Status: AC
  Administered 2021-07-20: 15 mL via OROMUCOSAL
  Filled 2021-07-20: qty 15

## 2021-07-20 MED ORDER — ASPIRIN EC 81 MG PO TBEC
81.0000 mg | DELAYED_RELEASE_TABLET | Freq: Every day | ORAL | Status: DC
  Administered 2021-07-20 – 2021-07-21 (×2): 81 mg via ORAL
  Filled 2021-07-20 (×2): qty 1

## 2021-07-20 MED ORDER — SODIUM CHLORIDE 0.9% FLUSH
3.0000 mL | Freq: Two times a day (BID) | INTRAVENOUS | Status: DC
  Administered 2021-07-20: 3 mL via INTRAVENOUS

## 2021-07-20 MED ORDER — HYDROMORPHONE HCL 1 MG/ML IJ SOLN
INTRAMUSCULAR | Status: AC
  Filled 2021-07-20: qty 1

## 2021-07-20 MED ORDER — LACTATED RINGERS IV SOLN: INTRAVENOUS | Status: DC

## 2021-07-20 MED ORDER — KETAMINE HCL-SODIUM CHLORIDE 100-0.9 MG/10ML-% IV SOSY
PREFILLED_SYRINGE | INTRAVENOUS | Status: DC | PRN
  Administered 2021-07-20: 25 mg via INTRAVENOUS
  Administered 2021-07-20 (×2): 12.5 mg via INTRAVENOUS

## 2021-07-20 MED ORDER — DEXAMETHASONE 4 MG PO TABS
4.0000 mg | ORAL_TABLET | Freq: Four times a day (QID) | ORAL | Status: DC
  Administered 2021-07-20 – 2021-07-21 (×2): 4 mg via ORAL
  Filled 2021-07-20 (×2): qty 1

## 2021-07-20 MED ORDER — BUPIVACAINE HCL (PF) 0.25 % IJ SOLN
INTRAMUSCULAR | Status: DC | PRN
  Administered 2021-07-20: 3 mL

## 2021-07-20 MED ORDER — METHOCARBAMOL 1000 MG/10ML IJ SOLN
500.0000 mg | Freq: Four times a day (QID) | INTRAVENOUS | Status: DC | PRN
  Filled 2021-07-20: qty 5

## 2021-07-20 MED ORDER — SENNA 8.6 MG PO TABS
1.0000 | ORAL_TABLET | Freq: Two times a day (BID) | ORAL | Status: DC
  Administered 2021-07-20 (×2): 8.6 mg via ORAL
  Filled 2021-07-20 (×2): qty 1

## 2021-07-20 MED ORDER — LIDOCAINE 2% (20 MG/ML) 5 ML SYRINGE
INTRAMUSCULAR | Status: DC | PRN
  Administered 2021-07-20: 100 mg via INTRAVENOUS

## 2021-07-20 MED ORDER — ONDANSETRON HCL 4 MG/2ML IJ SOLN: 4.0000 mg | Freq: Four times a day (QID) | INTRAMUSCULAR | Status: DC | PRN

## 2021-07-20 MED ORDER — GABAPENTIN 600 MG PO TABS
600.0000 mg | ORAL_TABLET | ORAL | Status: DC
  Administered 2021-07-20 (×2): 600 mg via ORAL
  Filled 2021-07-20 (×2): qty 1

## 2021-07-20 MED ORDER — GABAPENTIN 300 MG PO CAPS: 300.0000 mg | ORAL_CAPSULE | Freq: Every morning | ORAL | Status: DC

## 2021-07-20 MED ORDER — PHENOL 1.4 % MT LIQD: 1.0000 | OROMUCOSAL | Status: DC | PRN

## 2021-07-20 MED ORDER — MIDAZOLAM HCL 5 MG/5ML IJ SOLN
INTRAMUSCULAR | Status: DC | PRN
Start: 2021-07-20 — End: 2021-07-20
  Administered 2021-07-20: 2 mg via INTRAVENOUS

## 2021-07-20 MED ORDER — PROPOFOL 10 MG/ML IV BOLUS
INTRAVENOUS | Status: DC | PRN
  Administered 2021-07-20: 200 mg via INTRAVENOUS

## 2021-07-20 MED ORDER — FLUOXETINE HCL 20 MG PO CAPS: 60.0000 mg | ORAL_CAPSULE | Freq: Every day | ORAL | Status: DC

## 2021-07-20 MED ORDER — MENTHOL 3 MG MT LOZG: 1.0000 | LOZENGE | OROMUCOSAL | Status: DC | PRN

## 2021-07-20 MED ORDER — SUGAMMADEX SODIUM 200 MG/2ML IV SOLN
INTRAVENOUS | Status: DC | PRN
Start: 2021-07-20 — End: 2021-07-20
  Administered 2021-07-20: 200 mg via INTRAVENOUS

## 2021-07-20 MED ORDER — THROMBIN 20000 UNITS EX SOLR: CUTANEOUS | Status: DC | PRN

## 2021-07-20 MED ORDER — ACETAMINOPHEN 650 MG RE SUPP: 650.0000 mg | RECTAL | Status: DC | PRN

## 2021-07-20 MED ORDER — CEFAZOLIN SODIUM-DEXTROSE 2-4 GM/100ML-% IV SOLN
2.0000 g | Freq: Three times a day (TID) | INTRAVENOUS | Status: AC
  Administered 2021-07-20 (×2): 2 g via INTRAVENOUS
  Filled 2021-07-20 (×2): qty 100

## 2021-07-20 MED ORDER — BUPIVACAINE HCL (PF) 0.25 % IJ SOLN
INTRAMUSCULAR | Status: AC
  Filled 2021-07-20: qty 30

## 2021-07-20 SURGICAL SUPPLY — 57 items
BAG COUNTER SPONGE SURGICOUNT (BAG) ×2 IMPLANT
BASKET BONE COLLECTION (BASKET) ×2 IMPLANT
BENZOIN TINCTURE PRP APPL 2/3 (GAUZE/BANDAGES/DRESSINGS) ×2 IMPLANT
BLADE CLIPPER SURG (BLADE) IMPLANT
BUR CARBIDE MATCH 3.0 (BURR) ×2 IMPLANT
CANISTER SUCT 3000ML PPV (MISCELLANEOUS) ×2 IMPLANT
CNTNR URN SCR LID CUP LEK RST (MISCELLANEOUS) ×1 IMPLANT
CONT SPEC 4OZ STRL OR WHT (MISCELLANEOUS) ×2
COVER BACK TABLE 60X90IN (DRAPES) ×2 IMPLANT
DERMABOND ADVANCED (GAUZE/BANDAGES/DRESSINGS) ×1
DERMABOND ADVANCED .7 DNX12 (GAUZE/BANDAGES/DRESSINGS) ×1 IMPLANT
DRAPE C-ARM 42X72 X-RAY (DRAPES) ×2 IMPLANT
DRAPE C-ARMOR (DRAPES) ×2 IMPLANT
DRAPE LAPAROTOMY 100X72X124 (DRAPES) ×2 IMPLANT
DRAPE SURG 17X23 STRL (DRAPES) ×2 IMPLANT
DURAPREP 26ML APPLICATOR (WOUND CARE) ×2 IMPLANT
ELECT REM PT RETURN 9FT ADLT (ELECTROSURGICAL) ×2
ELECTRODE REM PT RTRN 9FT ADLT (ELECTROSURGICAL) ×1 IMPLANT
EVACUATOR 1/8 PVC DRAIN (DRAIN) ×2 IMPLANT
GAUZE 4X4 16PLY ~~LOC~~+RFID DBL (SPONGE) IMPLANT
GLOVE SURG ENC MOIS LTX SZ7 (GLOVE) IMPLANT
GLOVE SURG ENC MOIS LTX SZ8 (GLOVE) ×4 IMPLANT
GLOVE SURG UNDER POLY LF SZ7 (GLOVE) IMPLANT
GOWN STRL REUS W/ TWL LRG LVL3 (GOWN DISPOSABLE) IMPLANT
GOWN STRL REUS W/ TWL XL LVL3 (GOWN DISPOSABLE) ×2 IMPLANT
GOWN STRL REUS W/TWL 2XL LVL3 (GOWN DISPOSABLE) IMPLANT
GOWN STRL REUS W/TWL LRG LVL3 (GOWN DISPOSABLE)
GOWN STRL REUS W/TWL XL LVL3 (GOWN DISPOSABLE) ×4
HEMOSTAT POWDER KIT SURGIFOAM (HEMOSTASIS) IMPLANT
KIT BASIN OR (CUSTOM PROCEDURE TRAY) ×2 IMPLANT
KIT GRAFTMAG DEL NEURO DISP (NEUROSURGERY SUPPLIES) ×2 IMPLANT
KIT TURNOVER KIT B (KITS) ×2 IMPLANT
MATRIX SPINE STRIP NEOCORE 5CC (Putty) ×1 IMPLANT
MILL MEDIUM DISP (BLADE) IMPLANT
NEEDLE HYPO 25X1 1.5 SAFETY (NEEDLE) ×2 IMPLANT
NS IRRIG 1000ML POUR BTL (IV SOLUTION) ×2 IMPLANT
PACK LAMINECTOMY NEURO (CUSTOM PROCEDURE TRAY) ×2 IMPLANT
PAD ARMBOARD 7.5X6 YLW CONV (MISCELLANEOUS) ×6 IMPLANT
ROD LORD LIPPED TI 5.5X45 (Rod) ×4 IMPLANT
SCREW CANC SHANK MOD 6.5X45 (Screw) ×4 IMPLANT
SCREW POLYAXIAL TULIP (Screw) ×4 IMPLANT
SET SCREW (Screw) ×8 IMPLANT
SET SCREW SPNE (Screw) ×4 IMPLANT
SPACER TRANSCEND 12X9X25 10D (Spacer) ×4 IMPLANT
SPONGE SURGIFOAM ABS GEL 100 (HEMOSTASIS) ×2 IMPLANT
SPONGE T-LAP 4X18 ~~LOC~~+RFID (SPONGE) IMPLANT
STRIP CLOSURE SKIN 1/2X4 (GAUZE/BANDAGES/DRESSINGS) ×4 IMPLANT
STRIP MATRIX NEOCORE 5CC (Putty) ×1 IMPLANT
SUT VIC AB 0 CT1 18XCR BRD8 (SUTURE) ×1 IMPLANT
SUT VIC AB 0 CT1 8-18 (SUTURE) ×2
SUT VIC AB 2-0 CP2 18 (SUTURE) ×2 IMPLANT
SUT VIC AB 3-0 SH 8-18 (SUTURE) ×4 IMPLANT
SYR CONTROL 10ML LL (SYRINGE) ×2 IMPLANT
TOWEL GREEN STERILE (TOWEL DISPOSABLE) ×2 IMPLANT
TOWEL GREEN STERILE FF (TOWEL DISPOSABLE) ×2 IMPLANT
TRAY FOLEY MTR SLVR 16FR STAT (SET/KITS/TRAYS/PACK) ×2 IMPLANT
WATER STERILE IRR 1000ML POUR (IV SOLUTION) ×2 IMPLANT

## 2021-07-20 NOTE — H&P (Signed)
Subjective: Patient is a 45 y.o. male admitted for plif. Onset of symptoms was several months ago, gradually worsening since that time.  The pain is rated severe, and is located at the across the lower back and radiates to legs. The pain is described as aching and occurs all day. The symptoms have been progressive. Symptoms are exacerbated by exercise, standing, and walking for more than a few minutes. MRI or CT showed adjacent level stenosis L3-4   Past Medical History:  Diagnosis Date   Arthritis    Depression    Esophageal reflux    Essential hypertension, benign    many years ago   Obstructive sleep apnea (adult) (pediatric)    OSA on CPAP    Spondylisthesis     Past Surgical History:  Procedure Laterality Date   ANTERIOR CERVICAL DECOMP/DISCECTOMY FUSION Right 01/04/2018   Procedure: ANTERIOR CERVICAL DECOMPRESSION FUSION CERVICAL FIVE-SIX, CERVICAL SIX-SEVEN WITH INSTRUMENTATION AND ALLOGRAFT;  Surgeon: Estill Bamberg, MD;  Location: [facility] OR;  Service: Orthopedics;  Laterality: Right;   BACK SURGERY     CERVICAL SPINE SURGERY     LUMBAR LAMINECTOMY/DECOMPRESSION MICRODISCECTOMY Right 11/04/2020   Procedure: SI Joint Fusion;  Surgeon: Bethann Goo, DO;  Location: [facility] OR;  Service: Neurosurgery;  Laterality: Right;   MULTIPLE TOOTH EXTRACTIONS     NASAL SEPTUM SURGERY     SACROILIAC JOINT FUSION Left 02/27/2021   Procedure: Left sacroiliac joint fusion;  Surgeon: Dawley, Alan Mulder, DO;  Location: [facility] OR;  Service: Neurosurgery;  Laterality: Left;   SHOULDER ARTHROSCOPY Right 2019   TONSILLECTOMY     uvula shaved     VARICOCELE EXCISION     WISDOM TOOTH EXTRACTION      Prior to Admission medications   Medication Sig Start Date End Date Taking? Authorizing Provider  buPROPion (WELLBUTRIN SR) 150 MG 12 hr tablet Take 150 mg by mouth daily.   Yes [provider]  diphenhydramine-acetaminophen (TYLENOL PM) 25-500 MG TABS tablet Take 2 tablets by mouth at bedtime.   Yes [provider]  FLUoxetine (PROZAC) 20 MG tablet Take 60 mg by mouth daily.   Yes [provider]  gabapentin (NEURONTIN) 300 MG capsule Take 300-600 mg by mouth See admin instructions. Take 300 mg in the morning, 600 mg at lunch, and 600 mg at bedtime   Yes [provider]  HYDROcodone-acetaminophen (NORCO) 10-325 MG tablet Take 1 tablet by mouth every 4 (four) hours as needed for moderate pain ((score 4 to 6)). 02/28/21  Yes Pool, Sherilyn Cooter, MD  VITAMIN D PO Take 1 capsule by mouth daily.   Yes [provider]  vitamin E 180 MG (400 UNITS) capsule Take 400 Units by mouth daily.   Yes [provider]  aspirin EC 81 MG tablet Take 1 tablet (81 mg total) by mouth daily. 11/26/20   Dawley, Troy C, DO  Carboxymethylcellulose Sodium (LUBRICANT EYE DROPS OP) Place 1 drop into both eyes 3 (three) times daily as needed (dry eyes).    [provider]  cyclobenzaprine (FLEXERIL) 10 MG tablet Take 1 tablet (10 mg total) by mouth 3 (three) times daily as needed for muscle spasms. Patient not taking: Reported on 07/10/2021 11/05/20   Dawley, Kendell Bane C, DO  traMADol (ULTRAM) 50 MG tablet Take 1 tablet (50 mg total) by mouth every 6 (six) hours as needed. Patient not taking: Reported on 07/10/2021 11/05/20 11/05/21  Dawley, Kendell Bane C, DO   Allergies  Allergen Reactions   Lamotrigine Rash  Eruption, Chill    Social History   Tobacco Use   Smoking status: Former    Types: Cigarettes   Smokeless tobacco: Former   Tobacco comments:    quit 2000  Substance Use Topics   Alcohol use: Yes    Comment: occasional    Family History  Problem Relation Age of Onset   Heart attack Father      Review of Systems  Positive ROS: neg  All other systems have been reviewed and were otherwise negative with the exception of those mentioned in the HPI and as above.  Objective: Vital signs in last 24 hours: Temp:  [97.6 F (36.4 C)] 97.6 F (36.4 C) (10/31 0559) Pulse Rate:  [63]  63 (10/31 0559) Resp:  [18] 18 (10/31 0559) BP: (137)/(78) 137/78 (10/31 0559) SpO2:  [98 %] 98 % (10/31 0559) Weight:  [113.4 kg] 113.4 kg (10/31 0559)  General Appearance: Alert, cooperative, no distress, appears stated age Head: Normocephalic, without obvious abnormality, atraumatic Eyes: PERRL, conjunctiva/corneas clear, EOM's intact    Neck: Supple, symmetrical, trachea midline Back: Symmetric, no curvature, ROM normal, no CVA tenderness Lungs:  respirations unlabored Heart: Regular rate and rhythm Abdomen: Soft, non-tender Extremities: Extremities normal, atraumatic, no cyanosis or edema Pulses: 2+ and symmetric all extremities Skin: Skin color, texture, turgor normal, no rashes or lesions  NEUROLOGIC:   Mental status: Alert and oriented x4,  no aphasia, good attention span, fund of knowledge, and memory Motor Exam - grossly normal Sensory Exam - grossly normal Reflexes: 1= Coordination - grossly normal Gait - grossly normal Balance - grossly normal Cranial Nerves: I: smell Not tested  II: visual acuity  OS: nl    OD: nl  II: visual fields Full to confrontation  II: pupils Equal, round, reactive to light  III,VII: ptosis None  III,IV,VI: extraocular muscles  Full ROM  V: mastication Normal  V: facial light touch sensation  Normal  V,VII: corneal reflex  Present  VII: facial muscle function - upper  Normal  VII: facial muscle function - lower Normal  VIII: hearing Not tested  IX: soft palate elevation  Normal  IX,X: gag reflex Present  XI: trapezius strength  5/5  XI: sternocleidomastoid strength 5/5  XI: neck flexion strength  5/5  XII: tongue strength  Normal    Data Review Lab Results  Component Value Date   WBC 6.5 07/16/2021   HGB 14.0 07/16/2021   HCT 43.7 07/16/2021   MCV 91.0 07/16/2021   PLT 432 (H) 07/16/2021   Lab Results  Component Value Date   NA 138 07/16/2021   K 4.0 07/16/2021   CL 106 07/16/2021   CO2 24 07/16/2021   BUN 10  07/16/2021   CREATININE 0.84 07/16/2021   GLUCOSE 100 (H) 07/16/2021   Lab Results  Component Value Date   INR 0.9 07/16/2021    Assessment/Plan:  Estimated body mass index is 34.87 kg/m as calculated from the following:   Height as of this encounter: 5\' 11"  (1.803 m).   Weight as of this encounter: 113.4 kg. Patient admitted for PLIF L3-4. Patient has failed a reasonable attempt at conservative therapy.  I explained the condition and procedure to the patient and answered any questions.  Patient wishes to proceed with procedure as planned. Understands risks/ benefits and typical outcomes of procedure.   07/20/2021 7:27 AM

## 2021-07-20 NOTE — Anesthesia Procedure Notes (Signed)
Procedure Name: Intubation Date/Time: 07/20/2021 7:54 AM Performed by: Lynnell Chad, CRNA Pre-anesthesia Checklist: Patient identified, Emergency Drugs available, Suction available and Patient being monitored Patient Re-evaluated:Patient Re-evaluated prior to induction Oxygen Delivery Method: Circle System Utilized Preoxygenation: Pre-oxygenation with 100% oxygen Induction Type: IV induction Ventilation: Mask ventilation without difficulty Laryngoscope Size: Miller and 3 Grade View: Grade I Tube type: Oral Tube size: 7.5 mm Number of attempts: 1 Airway Equipment and Method: Stylet and Oral airway Placement Confirmation: ETT inserted through vocal cords under direct vision, positive ETCO2 and breath sounds checked- equal and bilateral Secured at: 24 cm Tube secured with: Tape Dental Injury: Teeth and Oropharynx as per pre-operative assessment

## 2021-07-20 NOTE — Transfer of Care (Signed)
Immediate Anesthesia Transfer of Care Note  Patient: RIDWAN BONDY  Procedure(s) Performed: PLIF - L3-L4 - Posterior Lateral and Interbody fusion, removal of instrumentation L4-S1 (Back)  Patient Location: PACU  Anesthesia Type:General  Level of Consciousness: unresponsive and patient cooperative  Airway & Oxygen Therapy: Patient Spontanous Breathing and Patient connected to face mask oxygen  Post-op Assessment: Report given to RN and Post -op Vital signs reviewed and stable  Post vital signs: Reviewed and stable  Last Vitals:  Vitals Value Taken Time  BP 138/87 07/20/21 1116  Temp    Pulse 87 07/20/21 1118  Resp 14 07/20/21 1118  SpO2 100 % 07/20/21 1118  Vitals shown include unvalidated device data.  Last Pain:  Vitals:   07/20/21 0702  TempSrc:   PainSc: 8       Patients Stated Pain Goal: 3 (07/20/21 5051)  Complications: No notable events documented.

## 2021-07-20 NOTE — Op Note (Signed)
07/20/2021  11:05 AM  PATIENT:  Juan Long  45 y.o. male  PRE-OPERATIVE DIAGNOSIS: Adjacent level stenosis L3-4, adjacent level disc herniation L3-4 right, back and right leg pain, previous fusion L4-S1  POST-OPERATIVE DIAGNOSIS:  same  PROCEDURE:   1. Decompressive lumbar laminectomy, hemi facetectomy and foraminotomies L3-4 requiring more work than would be required for a simple exposure of the disk for PLIF in order to adequately decompress the neural elements and address the spinal stenosis 2. Posterior lumbar interbody fusion L3-4 using peek interbody cages packed with morcellized allograft and autograft  3. Posterior fixation L3-4 using Alphatec cortical pedicle screws.  4. Intertransverse arthrodesis L3-4 using morcellized autograft and allograft. 5.  Exploration of fusion L4-S1 with removal of segmental instrumentation L4-S1  SURGEON:  Marikay Alar, MD  ASSISTANTS: Verlin Dike FNP  ANESTHESIA:  General  EBL: 300 ml  Total I/O In: 1500 [I.V.:1500] Out: 600 [Urine:300; Blood:300]  BLOOD ADMINISTERED:none  DRAINS: none   INDICATION FOR PROCEDURE: This patient presented with back pain and right greater than left leg pain. Imaging revealed severe adjacent level stenosis L3-4 above previous L4-S1 fusion. The patient tried a reasonable attempt at conservative medical measures without relief. I recommended decompression and instrumented fusion to address the stenosis as well as the segmental  instability.  Patient understood the risks, benefits, and alternatives and potential outcomes and wished to proceed.  PROCEDURE DETAILS:  The patient was brought to the operating room. After induction of generalized endotracheal anesthesia the patient was rolled into the prone position on chest rolls and all pressure points were padded. The patient's lumbar region was cleaned and then prepped with DuraPrep and draped in the usual sterile fashion. Anesthesia was injected and then a  dorsal midline incision was made and carried down to the lumbosacral fascia. The fascia was opened and the paraspinous musculature was taken down in a subperiosteal fashion to expose L3-4 as well as the previously placed instrumentation.  We remove the locking caps from the tulip heads of the pedicle screws and then remove the rods.  I then pulled on each screw successively and it appeared all screws moved in this and suggesting arthrodesis.  We tried to explore the fusion as best we could to assure solid arthrodesis.  A self-retaining retractor was placed. Intraoperative fluoroscopy confirmed my level, and I started with placement of the L3 cortical pedicle screws. The pedicle screw entry zones were identified utilizing surface landmarks and  AP and lateral fluoroscopy. I scored the cortex with the high-speed drill and then used the hand drill to drill an upward and outward direction into the pedicle. I then tapped line to line. I then placed a 6.5 x 45 mm cortical pedicle screw into the pedicles of L3 bilaterally.    I then turned my attention to the decompression and complete lumbar laminectomies, hemi- facetectomies, and foraminotomies were performed at L3-4.  My nurse practitioner was directly involved in the decompression and exposure of the neural elements. the patient had significant spinal stenosis and this required more work than would be required for a simple exposure of the disc for posterior lumbar interbody fusion which would only require a limited laminotomy. Much more generous decompression and generous foraminotomy was undertaken in order to adequately decompress the neural elements and address the patient's leg pain. The yellow ligament was removed to expose the underlying dura and nerve roots, and generous foraminotomies were performed to adequately decompress the neural elements. Both the exiting and traversing nerve roots  were decompressed on both sides until a coronary dilator passed easily  along the nerve roots. Once the decompression was complete, I turned my attention to the posterior lower lumbar interbody fusion. The epidural venous vasculature was coagulated and cut sharply. Disc space was incised and the initial discectomy was performed with pituitary rongeurs. The disc space was distracted with sequential distractors to a height of 12 mm. We then used a series of scrapers and shavers to prepare the endplates for fusion. The midline was prepared with Epstein curettes. Once the complete discectomy was finished, we packed an appropriate sized interbody cage with local autograft and morcellized allograft, gently retracted the nerve root, and tapped the cage into position at L3-4.  The midline between the cages was packed with morselized autograft and allograft.     We then decorticated the transverse processes and laid a mixture of morcellized autograft and allograft out over these to perform intertransverse arthrodesis at L3-4. We then placed lordotic rods into the multiaxial screw heads of the pedicle screws and locked these in position with the locking caps and anti-torque device while achieving compression of her grafts. We then checked our construct with AP and lateral fluoroscopy. Irrigated with copious amounts of bacitracin-containing saline solution. Inspected the nerve roots once again to assure adequate decompression, lined to the dura with Gelfoam, placed vancomycin powder into the wound, and then we closed the muscle and the fascia with 0 Vicryl. Closed the subcutaneous tissues with 2-0 Vicryl and subcuticular tissues with 3-0 Vicryl. The skin was closed with benzoin and Steri-Strips. Dressing was then applied, the patient was awakened from general anesthesia and transported to the recovery room in stable condition. At the end of the procedure all sponge, needle and instrument counts were correct.   PLAN OF CARE: admit to inpatient  PATIENT DISPOSITION:  PACU - hemodynamically  stable.   Delay start of Pharmacological VTE agent (>24hrs) due to surgical blood loss or risk of bleeding:  yes

## 2021-07-21 MED ORDER — HYDROCODONE-ACETAMINOPHEN 10-325 MG PO TABS: 1.0000 | ORAL_TABLET | ORAL | 0 refills | Status: DC | PRN

## 2021-07-21 MED ORDER — CYCLOBENZAPRINE HCL 10 MG PO TABS: 10.0000 mg | ORAL_TABLET | Freq: Three times a day (TID) | ORAL | 1 refills | Status: DC | PRN

## 2021-07-21 NOTE — Discharge Summary (Signed)
Physician Discharge Summary  Patient ID: Juan Long MRN: [id number] DOB/AGE: 1976/08/05 45 y.o.  Admit date: 07/20/2021 Discharge date: 07/21/2021  Admission Diagnoses: Adjacent level stenosis L3-4, adjacent level disc herniation L3-4 right, back and right leg pain, previous fusion L4-S1     Discharge Diagnoses: same   Discharged Condition: good  Hospital Course: The patient was admitted on 07/20/2021 and taken to the operating room where the patient underwent PLIF 3-4. The patient tolerated the procedure well and was taken to the recovery room and then to the floor in stable condition. The hospital course was routine. There were no complications. The wound remained clean dry and intact. Pt had appropriate back soreness. No complaints of leg pain or new N/T/W. The patient remained afebrile with stable vital signs, and tolerated a regular diet. The patient continued to increase activities, and pain was well controlled with oral pain medications.   Consults: None  Significant Diagnostic Studies:  Results for orders placed or performed during the hospital encounter of 07/16/21  Surgical pcr screen   Specimen: Nasal Mucosa; Nasal Swab  Result Value Ref Range   MRSA, PCR NEGATIVE NEGATIVE   Staphylococcus aureus POSITIVE (A) NEGATIVE  SARS CORONAVIRUS 2 (TAT 6-24 HRS) Nasopharyngeal Nasopharyngeal Swab   Specimen: Nasopharyngeal Swab  Result Value Ref Range   SARS Coronavirus 2 NEGATIVE NEGATIVE  CBC per protocol  Result Value Ref Range   WBC 6.5 4.0 - 10.5 K/uL   RBC 4.80 4.22 - 5.81 MIL/uL   Hemoglobin 14.0 13.0 - 17.0 g/dL   HCT 65.4 65.0 - 35.4 %   MCV 91.0 80.0 - 100.0 fL   MCH 29.2 26.0 - 34.0 pg   MCHC 32.0 30.0 - 36.0 g/dL   RDW 65.6 81.2 - 75.1 %   Platelets 432 (H) 150 - 400 K/uL   nRBC 0.0 0.0 - 0.2 %  PT- INR at PAT visit (Pre-admission Testing) per protocol  Result Value Ref Range   Prothrombin Time 12.1 11.4 - 15.2 seconds   INR 0.9 0.8 - 1.2  Basic  metabolic panel per protocol  Result Value Ref Range   Sodium 138 135 - 145 mmol/L   Potassium 4.0 3.5 - 5.1 mmol/L   Chloride 106 98 - 111 mmol/L   CO2 24 22 - 32 mmol/L   Glucose, Bld 100 (H) 70 - 99 mg/dL   BUN 10 6 - 20 mg/dL   Creatinine, Ser 7.00 0.61 - 1.24 mg/dL   Calcium 9.4 8.9 - 17.4 mg/dL   GFR, Estimated >94 >49 mL/min   Anion gap 8 5 - 15  Type and screen [facility]  Result Value Ref Range   ABO/RH(D) O POS    Antibody Screen NEG    Sample Expiration 07/30/2021,2359    Extend sample reason      NO TRANSFUSIONS OR PREGNANCY IN THE PAST 3 MONTHS Performed at [facility], [address], [place], [place] [address]     DG Lumbar Spine 2-3 Views  Result Date: 07/20/2021 CLINICAL DATA:  Surgical fusion at L3-L4 level EXAM: LUMBAR SPINE - 2-3 VIEW COMPARISON:  06/19/2020 FINDINGS: Fluoroscopic images show interval surgical fusion at L3-L4 level. Intervertebral disc spacer is noted. There is previous surgical fusion from L4-S1 levels. Fluoroscopic time was 38 seconds. IMPRESSION: Fluoroscopic assistance was provided for surgical fusion at L3-L4 level. Electronically Signed   By: Ernie Avena M.D.   On: 07/20/2021 10:55   DG C-Arm 1-60 Min-No Report  Result Date: 07/20/2021 Fluoroscopy was utilized by the requesting physician.  No radiographic interpretation.   DG C-Arm 1-60 Min-No Report  Result Date: 07/20/2021 Fluoroscopy was utilized by the requesting physician.  No radiographic interpretation.   DG C-Arm 1-60 Min-No Report  Result Date: 07/20/2021 Fluoroscopy was utilized by the requesting physician.  No radiographic interpretation.    Antibiotics:  Anti-infectives (From admission, onward)    Start     Dose/Rate Route Frequency Ordered Stop   07/20/21 1600  ceFAZolin (ANCEF) IVPB 2g/100 mL premix        2 g 200 mL/hr over 30 Minutes Intravenous Every 8 hours 07/20/21 1243 07/20/21 2355   07/20/21 1037  vancomycin (VANCOCIN)  powder  Status:  Discontinued          As needed 07/20/21 1037 07/20/21 1112   07/20/21 0615  ceFAZolin (ANCEF) IVPB 2g/100 mL premix        2 g 200 mL/hr over 30 Minutes Intravenous On call to O.R. 07/20/21 0606 07/20/21 0813       Discharge Exam: Blood pressure 136/66, pulse 77, temperature 98.1 F (36.7 C), temperature source Oral, resp. rate 20, height 5\' 11"  (1.803 m), weight 113.4 kg, SpO2 98 %. Neurologic: Grossly normal Ambulating and voiding well, incision cdi   Discharge Medications:   Allergies as of 07/21/2021       Reactions   Lamotrigine Rash   Eruption, Chill        Medication List     TAKE these medications    aspirin EC 81 MG tablet Take 1 tablet (81 mg total) by mouth daily.   buPROPion 150 MG 12 hr tablet Commonly known as: WELLBUTRIN SR Take 150 mg by mouth daily.   cyclobenzaprine 10 MG tablet Commonly known as: FLEXERIL Take 1 tablet (10 mg total) by mouth 3 (three) times daily as needed for muscle spasms.   diphenhydramine-acetaminophen 25-500 MG Tabs tablet Commonly known as: TYLENOL PM Take 2 tablets by mouth at bedtime.   FLUoxetine 20 MG tablet Commonly known as: PROZAC Take 60 mg by mouth daily.   gabapentin 300 MG capsule Commonly known as: NEURONTIN Take 300-600 mg by mouth See admin instructions. Take 300 mg in the morning, 600 mg at lunch, and 600 mg at bedtime   HYDROcodone-acetaminophen 10-325 MG tablet Commonly known as: NORCO Take 1 tablet by mouth every 4 (four) hours as needed for moderate pain ((score 4 to 6)).   LUBRICANT EYE DROPS OP Place 1 drop into both eyes 3 (three) times daily as needed (dry eyes).   traMADol 50 MG tablet Commonly known as: Ultram Take 1 tablet (50 mg total) by mouth every 6 (six) hours as needed.   VITAMIN D PO Take 1 capsule by mouth daily.   vitamin E 180 MG (400 UNITS) capsule Take 400 Units by mouth daily.               Durable Medical Equipment  (From admission, onward)            Start     Ordered   07/20/21 1324  DME Walker rolling  Once       Question:  Patient needs a walker to treat with the following condition  Answer:  S/P lumbar fusion   07/20/21 1323   07/20/21 1324  DME 3 n 1  Once        07/20/21 1323            Disposition: home  Final Dx: PLIF L3-4  Discharge Instructions     Call MD for:  difficulty breathing, headache or visual disturbances   Complete by: As directed    Call MD for:  hives   Complete by: As directed    Call MD for:  persistant nausea and vomiting   Complete by: As directed    Call MD for:  redness, tenderness, or signs of infection (pain, swelling, redness, odor or green/yellow discharge around incision site)   Complete by: As directed    Call MD for:  severe uncontrolled pain   Complete by: As directed    Call MD for:  temperature >100.4   Complete by: As directed    Diet - low sodium heart healthy   Complete by: As directed    Driving Restrictions   Complete by: As directed    No driving for 2 weeks, no riding in the car for 1 week   Increase activity slowly   Complete by: As directed    Lifting restrictions   Complete by: As directed    No lifting more than 8 lbs   Remove dressing in 48 hours   Complete by: As directed           Signed: Tiana Loft Greogory Cornette 07/21/2021, 7:47 AM

## 2021-07-21 NOTE — Plan of Care (Signed)
Patient alert and oriented, mae's well, voiding adequate amount of urine, swallowing without difficulty, no c/o pain at time of discharge. Patient discharged home with family. Script and discharged instructions given to patient. Patient and family stated understanding of instructions given. Patient has an appointment with Dr. Jones °

## 2021-07-21 NOTE — Evaluation (Signed)
Occupational Therapy Evaluation Patient Details Name: Juan Long MRN: [id number] DOB: 12/10/1975 Today's Date: 07/21/2021   History of Present Illness Juan Long  45 y.o. male who is now s/p PLIF 3-4 due to severe adjacent level stenosis L3-4 above previous L4-S1 fusion. PMHx: arthritis, depression, essential hypertension, sleep apnea, 3 previous back surgeries, 2019 shoulder arthroplasty   Clinical Impression   Juan Long was mod I PTA with intermittent use of rollator or SPC, indep with ADLs, drives and does some part time work. He lives with his wife and son who can assist 24/7 if needed. 1 level home, 2 STE. Pt demonstrated ADLs and functional mobility at mod I level, he verbalized great understanding of back precautions and compensatory techniques to maintain precautions during functional tasks. Pt does not require further OT acutely. Recommend d/c home with supervision initially for ADLs and mobility.      Recommendations for follow up therapy are one component of a multi-disciplinary discharge planning process, led by the attending physician.  Recommendations may be updated based on patient status, additional functional criteria and insurance authorization.   Follow Up Recommendations  No OT follow up    Assistance Recommended at Discharge Intermittent Supervision/Assistance  Functional Status Assessment  Patient has had a recent decline in their functional status and demonstrates the ability to make significant improvements in function in a reasonable and predictable amount of time.  Equipment Recommendations  None recommended by OT       Precautions / Restrictions Precautions Precautions: Back;Fall Precaution Booklet Issued: Yes (comment) Required Braces or Orthoses: Spinal Brace Spinal Brace: Lumbar corset;Applied in sitting position Restrictions Weight Bearing Restrictions: No      Mobility Bed Mobility Overal bed mobility: Modified Independent              General bed mobility comments: great log roll, no assist or cues needed    Transfers Overall transfer level: Modified independent Equipment used: None               General transfer comment: pt demonstrated mod I level for transfers and functional ambulation in the room without AD      Balance                                           ADL either performed or assessed with clinical judgement   ADL Overall ADL's : Modified independent                                       General ADL Comments: pt demonstrated modified independence with ADLs with use of compensatory techniques to maintain back precautions; verbalized understanding of back precuations and further techniques to maintain during functional tasks.     Vision Baseline Vision/History: 0 No visual deficits Ability to See in Adequate Light: 0 Adequate Vision Assessment?: No apparent visual deficits     Perception     Praxis      Pertinent Vitals/Pain Pain Assessment: No/denies pain     Hand Dominance Right   Extremity/Trunk Assessment Upper Extremity Assessment Upper Extremity Assessment: Overall WFL for tasks assessed   Lower Extremity Assessment Lower Extremity Assessment: Defer to PT evaluation   Cervical / Trunk Assessment Cervical / Trunk Assessment: Back Surgery   Communication Communication Communication: No difficulties   Cognition  Arousal/Alertness: Awake/alert Behavior During Therapy: WFL for tasks assessed/performed Overall Cognitive Status: Within Functional Limits for tasks assessed                                       General Comments  VSS on RA, pt explained this is his 4th recent back sx and he has great understanding of all precautions and compensatory techniques            Home Living Family/patient expects to be discharged to:: Private residence Living Arrangements: Spouse/significant other Available Help at Discharge:  Family;Available 24 hours/day Type of Home: House Home Access: Stairs to enter [employer] of Steps: 2 Entrance Stairs-Rails: None Home Layout: One level     Bathroom Shower/Tub: Producer, television/film/video: Standard     Home Equipment: Cane - single point;Rollator (4 wheels)   Additional Comments: wife works from home      Prior Functioning/Environment Prior Level of Function : Independent/Modified Independent             Mobility Comments: intermittently ambulated with [facility] or rollator ADLs Comments: indep, able to assume figure four position        OT Problem List: Decreased knowledge of precautions;Pain      OT Treatment/Interventions:      OT Goals(Current goals can be found in the care plan section) Acute Rehab OT Goals Patient Stated Goal: home asap OT Goal Formulation: All assessment and education complete, DC therapy   AM-PAC OT "6 Clicks" Daily Activity     Outcome Measure Help from another person eating meals?: None Help from another person taking care of personal grooming?: None Help from another person toileting, which includes using toliet, bedpan, or urinal?: None Help from another person bathing (including washing, rinsing, drying)?: None Help from another person to put on and taking off regular upper body clothing?: None Help from another person to put on and taking off regular lower body clothing?: None 6 Click Score: 24   End of Session Equipment Utilized During Treatment: Back brace Nurse Communication: Mobility status  Activity Tolerance: Patient tolerated treatment well Patient left: in bed;with call bell/phone within reach  OT Visit Diagnosis: Pain                Time: 6759-1638 OT Time Calculation (min): 14 min Charges:  OT General Charges $OT Visit: 1 Visit OT Evaluation $OT Eval Low Complexity: 1 Low   Juan Long 07/21/2021, 9:19 AM

## 2021-07-21 NOTE — Evaluation (Signed)
Physical Therapy Evaluation and Discharge Patient Details Name: Juan Long MRN: [id number] DOB: 06/12/1976 Today's Date: 07/21/2021  History of Present Illness  Pt is a 45 y/o male who is now s/p PLIF 3-4 due to severe adjacent level stenosis L3-4 above previous L4-S1 fusion. PMH significant for essential hypertension, sleep apnea, 3 previous back surgeries, 2019 shoulder arthroplasty.   Clinical Impression  Patient evaluated by Physical Therapy with no further acute PT needs identified. All education has been completed and the patient has no further questions. Pt was able to demonstrate transfers and ambulation with gross modified independence and no AD. Pt was educated on precautions, brace application/wearing schedule, appropriate activity progression, and car transfer. Several instances of pt mildly breaking precautions but overall mobilizing well. See below for any follow-up Physical Therapy or equipment needs. PT is signing off. Thank you for this referral.        Recommendations for follow up therapy are one component of a multi-disciplinary discharge planning process, led by the attending physician.  Recommendations may be updated based on patient status, additional functional criteria and insurance authorization.  Follow Up Recommendations No PT follow up    Assistance Recommended at Discharge PRN  Functional Status Assessment Patient has had a recent decline in their functional status and demonstrates the ability to make significant improvements in function in a reasonable and predictable amount of time.  Equipment Recommendations  None recommended by PT    Recommendations for Other Services       Precautions / Restrictions Precautions Precautions: Back;Fall Precaution Booklet Issued: Yes (comment) Precaution Comments: Reviewed handout and pt was cued for precautions during functional mobility. Required Braces or Orthoses: Spinal Brace Spinal Brace: Lumbar corset;Applied  in sitting position Restrictions Weight Bearing Restrictions: No      Mobility  Bed Mobility Overal bed mobility: Modified Independent             General bed mobility comments: HOB mildly elevated; no assist requried with good log roll technique.    Transfers Overall transfer level: Modified independent Equipment used: None               General transfer comment: No assist required. Pt was able to easily transition to/from standing.    Ambulation/Gait Ambulation/Gait assistance: Modified independent (Device/Increase time) Gait Distance (Feet): 560 Feet Assistive device: None Gait Pattern/deviations: Step-through pattern;Decreased stride length Gait velocity: Decreased Gait velocity interpretation: >2.62 ft/sec, indicative of community ambulatory General Gait Details: Overall pt maintaining good posture throughout gait training. No unsteadiness or LOB noted.  Stairs            Wheelchair Mobility    Modified Rankin (Stroke Patients Only)       Balance Overall balance assessment: No apparent balance deficits (not formally assessed)                                           Pertinent Vitals/Pain Pain Assessment: Faces Faces Pain Scale: Hurts a little bit Pain Location: Incision site Pain Descriptors / Indicators: Operative site guarding;Sore Pain Intervention(s): Limited activity within patient's tolerance;Monitored during session;Repositioned    Home Living Family/patient expects to be discharged to:: Private residence Living Arrangements: Spouse/significant other Available Help at Discharge: Family;Available 24 hours/day Type of Home: House Home Access: Stairs to enter Entrance Stairs-Rails: None Entrance Stairs-Number of Steps: 2   Home Layout: One level Home Equipment: Cane -  single point;Rollator (4 wheels) Additional Comments: wife works from home    Prior Function Prior Level of Function : Independent/Modified  Independent             Mobility Comments: intermittently ambulated with [facility] or rollator ADLs Comments: indep, able to assume figure four position     Hand Dominance   Dominant Hand: Right    Extremity/Trunk Assessment   Upper Extremity Assessment Upper Extremity Assessment: Defer to OT evaluation    Lower Extremity Assessment Lower Extremity Assessment: Generalized weakness (Mild; consistent with pre-op diagnosis)    Cervical / Trunk Assessment Cervical / Trunk Assessment: Back Surgery  Communication   Communication: No difficulties  Cognition Arousal/Alertness: Awake/alert Behavior During Therapy: WFL for tasks assessed/performed Overall Cognitive Status: Within Functional Limits for tasks assessed                                          General Comments General comments (skin integrity, edema, etc.): VSS on RA, pt explained this is his 4th recent back sx and he has great understanding of all precautions and compensatory techniques    Exercises     Assessment/Plan    PT Assessment Patient does not need any further PT services  PT Problem List         PT Treatment Interventions      PT Goals (Current goals can be found in the Care Plan section)  Acute Rehab PT Goals Patient Stated Goal: Home today, no other back surgeries PT Goal Formulation: All assessment and education complete, DC therapy    Frequency     Barriers to discharge        Co-evaluation               AM-PAC PT "6 Clicks" Mobility  Outcome Measure Help needed turning from your back to your side while in a flat bed without using bedrails?: None Help needed moving from lying on your back to sitting on the side of a flat bed without using bedrails?: None Help needed moving to and from a bed to a chair (including a wheelchair)?: None Help needed standing up from a chair using your arms (e.g., wheelchair or bedside chair)?: None Help needed to walk in hospital  room?: None Help needed climbing 3-5 steps with a railing? : A Little 6 Click Score: 23    End of Session Equipment Utilized During Treatment: Back brace Activity Tolerance: Patient tolerated treatment well Patient left: in bed;with call bell/phone within reach Nurse Communication: Mobility status PT Visit Diagnosis: Unsteadiness on feet (R26.81);Pain Pain - part of body:  (back)    Time: 9518-8416 PT Time Calculation (min) (ACUTE ONLY): 11 min   Charges:   PT Evaluation $PT Eval Low Complexity: 1 Low          Conni Slipper, PT, DPT Acute Rehabilitation Services Pager: [phone] Office: [phone]   Marylynn Pearson 07/21/2021, 10:16 AM

## 2021-07-21 NOTE — Anesthesia Postprocedure Evaluation (Signed)
Anesthesia Post Note  Patient: Juan Long  Procedure(s) Performed: PLIF - L3-L4 - Posterior Lateral and Interbody fusion, removal of instrumentation L4-S1 (Back)     Patient location during evaluation: PACU Anesthesia Type: General Level of consciousness: awake and alert Pain management: pain level controlled Vital Signs Assessment: post-procedure vital signs reviewed and stable Respiratory status: spontaneous breathing, nonlabored ventilation, respiratory function stable and patient connected to nasal cannula oxygen Cardiovascular status: blood pressure returned to baseline and stable Postop Assessment: no apparent nausea or vomiting Anesthetic complications: no   No notable events documented.  Last Vitals:  Vitals:   07/21/21 0421 07/21/21 0754  BP: 136/66 (!) 141/69  Pulse: 77 81  Resp: 20 18  Temp: 36.7 C 36.6 C  SpO2: 98% 98%    Last Pain:  Vitals:   07/21/21 0754  TempSrc: Oral  PainSc:                  Elmarie Devlin S

## 2021-08-15 ENCOUNTER — Other Ambulatory Visit: Payer: Self-pay

## 2021-08-15 ENCOUNTER — Emergency Department (HOSPITAL_COMMUNITY): Payer: No Typology Code available for payment source

## 2021-08-15 ENCOUNTER — Inpatient Hospital Stay (HOSPITAL_COMMUNITY)
Admission: EM | Admit: 2021-08-15 | Discharge: 2021-08-18 | DRG: 948 | Disposition: A | Discharge status: Home / Self Care | Payer: No Typology Code available for payment source | Attending: Neurological Surgery | Admitting: Neurological Surgery

## 2021-08-15 DIAGNOSIS — Z8249 Family history of ischemic heart disease and other diseases of the circulatory system: Secondary | ICD-10-CM

## 2021-08-15 DIAGNOSIS — Z888 Allergy status to other drugs, medicaments and biological substances status: Secondary | ICD-10-CM

## 2021-08-15 DIAGNOSIS — Z981 Arthrodesis status: Secondary | ICD-10-CM

## 2021-08-15 DIAGNOSIS — Z87891 Personal history of nicotine dependence: Secondary | ICD-10-CM

## 2021-08-15 DIAGNOSIS — I10 Essential (primary) hypertension: Secondary | ICD-10-CM | POA: Diagnosis present

## 2021-08-15 DIAGNOSIS — Z20822 Contact with and (suspected) exposure to covid-19: Secondary | ICD-10-CM | POA: Diagnosis present

## 2021-08-15 DIAGNOSIS — Z7982 Long term (current) use of aspirin: Secondary | ICD-10-CM

## 2021-08-15 DIAGNOSIS — Z79899 Other long term (current) drug therapy: Secondary | ICD-10-CM

## 2021-08-15 DIAGNOSIS — M549 Dorsalgia, unspecified: Secondary | ICD-10-CM | POA: Diagnosis present

## 2021-08-15 DIAGNOSIS — G4733 Obstructive sleep apnea (adult) (pediatric): Secondary | ICD-10-CM | POA: Diagnosis present

## 2021-08-15 DIAGNOSIS — M1990 Unspecified osteoarthritis, unspecified site: Secondary | ICD-10-CM | POA: Diagnosis present

## 2021-08-15 DIAGNOSIS — G8918 Other acute postprocedural pain: Principal | ICD-10-CM | POA: Diagnosis present

## 2021-08-15 DIAGNOSIS — K219 Gastro-esophageal reflux disease without esophagitis: Secondary | ICD-10-CM | POA: Diagnosis present

## 2021-08-15 LAB — COMPREHENSIVE METABOLIC PANEL
ALT: 35 U/L (ref 0–44)
AST: 28 U/L (ref 15–41)
Albumin: 4.2 g/dL (ref 3.5–5.0)
Alkaline Phosphatase: 102 U/L (ref 38–126)
Anion gap: 10 (ref 5–15)
BUN: 13 mg/dL (ref 6–20)
CO2: 24 mmol/L (ref 22–32)
Calcium: 9.6 mg/dL (ref 8.9–10.3)
Chloride: 103 mmol/L (ref 98–111)
Creatinine, Ser: 1.01 mg/dL (ref 0.61–1.24)
GFR, Estimated: >60 mL/min (ref >60)
Glucose, Bld: 100 mg/dL — ABNORMAL HIGH (ref 70–99)
Potassium: 4.2 mmol/L (ref 3.5–5.1)
Sodium: 137 mmol/L (ref 135–145)
Total Bilirubin: 0.5 mg/dL (ref 0.3–1.2)
Total Protein: 7.7 g/dL (ref 6.5–8.1)

## 2021-08-15 LAB — URINALYSIS, ROUTINE W REFLEX MICROSCOPIC
Bacteria, UA: NONE SEEN
Bilirubin Urine: NEGATIVE
Glucose, UA: NEGATIVE mg/dL
Hgb urine dipstick: NEGATIVE
Ketones, ur: 5 mg/dL — AB
Leukocytes,Ua: NEGATIVE
Nitrite: NEGATIVE
Protein, ur: 30 mg/dL — AB
Specific Gravity, Urine: 1.038 — ABNORMAL HIGH (ref 1.005–1.030)
pH: 5 (ref 5.0–8.0)

## 2021-08-15 LAB — CBC WITH DIFFERENTIAL/PLATELET
Abs Immature Granulocytes: 0.17 10*3/uL — ABNORMAL HIGH (ref 0.00–0.07)
Basophils Absolute: 0.1 10*3/uL (ref 0.0–0.1)
Basophils Relative: 0 %
Eosinophils Absolute: 0.2 10*3/uL (ref 0.0–0.5)
Eosinophils Relative: 1 %
HCT: 46.4 % (ref 39.0–52.0)
Hemoglobin: 14.8 g/dL (ref 13.0–17.0)
Immature Granulocytes: 1 %
Lymphocytes Relative: 15 %
Lymphs Abs: 3.5 10*3/uL (ref 0.7–4.0)
MCH: 28.8 pg (ref 26.0–34.0)
MCHC: 31.9 g/dL (ref 30.0–36.0)
MCV: 90.4 fL (ref 80.0–100.0)
Monocytes Absolute: 1.2 10*3/uL — ABNORMAL HIGH (ref 0.1–1.0)
Monocytes Relative: 5 %
Neutro Abs: 18.9 10*3/uL — ABNORMAL HIGH (ref 1.7–7.7)
Neutrophils Relative %: 78 %
Platelets: 583 10*3/uL — ABNORMAL HIGH (ref 150–400)
RBC: 5.13 MIL/uL (ref 4.22–5.81)
RDW: 14.3 % (ref 11.5–15.5)
WBC: 24.1 10*3/uL — ABNORMAL HIGH (ref 4.0–10.5)
nRBC: 0 % (ref 0.0–0.2)

## 2021-08-15 MED ORDER — FENTANYL CITRATE PF 50 MCG/ML IJ SOSY
100.0000 ug | PREFILLED_SYRINGE | Freq: Once | INTRAMUSCULAR | Status: AC
  Administered 2021-08-16: 100 ug via INTRAVENOUS
  Filled 2021-08-15: qty 2

## 2021-08-15 MED ORDER — ONDANSETRON HCL 4 MG/2ML IJ SOLN
4.0000 mg | Freq: Once | INTRAMUSCULAR | Status: AC
  Administered 2021-08-16: 4 mg via INTRAVENOUS
  Filled 2021-08-15: qty 2

## 2021-08-15 NOTE — ED Triage Notes (Signed)
Pt c/o back pain. Pt reports he had back surgery 07-20-2021.

## 2021-08-15 NOTE — ED Provider Notes (Signed)
Emergency Medicine Provider Triage Evaluation Note  DRU PRIMEAU , a 45 y.o. male  was evaluated in triage.  Pt complains of had lumbar fusion (second) >3 weeks ago. Developed back pain over the past week that is worsening. No numbness or weakness that is new. No bowel or bladder incontinence.   Review of Systems  Positive: Back pain Negative: Fever  Physical Exam  BP 125/89   Pulse (!) 105   Temp 98.4 F (36.9 C) (Oral)   Resp 16   Ht 5\' 11"  (1.803 m)   Wt 111.6 kg   SpO2 97%   BMI 34.31 kg/m  Gen:   Awake, uncomfortable Resp:  Normal effort  MSK:   Moves extremities without difficulty  Other:  Incision clean and dry mildine spine.   Medical Decision Making  Medically screening exam initiated at 9:34 PM.  Appropriate orders placed.  MARGARITO DEHAAS was informed that the remainder of the evaluation will be completed by another provider, this initial triage assessment does not replace that evaluation, and the importance of remaining in the ED until their evaluation is complete.  Low back pain without significant associated symptoms.    Andria Rhein, Gailen Shelter 08/15/21 2137    2138, MD 08/15/21 2148

## 2021-08-16 ENCOUNTER — Encounter (HOSPITAL_COMMUNITY): Payer: Self-pay | Admitting: Neurological Surgery

## 2021-08-16 ENCOUNTER — Emergency Department (HOSPITAL_COMMUNITY): Payer: No Typology Code available for payment source

## 2021-08-16 DIAGNOSIS — G8918 Other acute postprocedural pain: Secondary | ICD-10-CM | POA: Diagnosis present

## 2021-08-16 LAB — C-REACTIVE PROTEIN: CRP: 3.7 mg/dL — ABNORMAL HIGH (ref <1.0)

## 2021-08-16 LAB — RESP PANEL BY RT-PCR (FLU A&B, COVID) ARPGX2
Influenza A by PCR: NEGATIVE
Influenza B by PCR: NEGATIVE
SARS Coronavirus 2 by RT PCR: NEGATIVE

## 2021-08-16 LAB — SEDIMENTATION RATE: Sed Rate: 16 mm/hr (ref 0–16)

## 2021-08-16 MED ORDER — DEXAMETHASONE SODIUM PHOSPHATE 10 MG/ML IJ SOLN
10.0000 mg | Freq: Once | INTRAMUSCULAR | Status: AC
  Administered 2021-08-16: 04:00:00 10 mg via INTRAVENOUS
  Filled 2021-08-16: qty 1

## 2021-08-16 MED ORDER — FLUOXETINE HCL 20 MG PO CAPS
60.0000 mg | ORAL_CAPSULE | Freq: Every day | ORAL | Status: DC
  Administered 2021-08-16 – 2021-08-18 (×3): 60 mg via ORAL
  Filled 2021-08-16 (×3): qty 3

## 2021-08-16 MED ORDER — HYDROXYZINE PAMOATE 25 MG PO CAPS: 25.0000 mg | ORAL_CAPSULE | Freq: Four times a day (QID) | ORAL | Status: DC | PRN

## 2021-08-16 MED ORDER — ONDANSETRON HCL 4 MG PO TABS: 4.0000 mg | ORAL_TABLET | Freq: Four times a day (QID) | ORAL | Status: DC | PRN

## 2021-08-16 MED ORDER — OXYCODONE HCL 5 MG PO TABS
5.0000 mg | ORAL_TABLET | ORAL | Status: DC | PRN
  Administered 2021-08-16 – 2021-08-18 (×4): 5 mg via ORAL
  Filled 2021-08-16 (×5): qty 1

## 2021-08-16 MED ORDER — FENTANYL CITRATE PF 50 MCG/ML IJ SOSY
100.0000 ug | PREFILLED_SYRINGE | Freq: Once | INTRAMUSCULAR | Status: AC
  Administered 2021-08-16: 03:00:00 100 ug via INTRAVENOUS
  Filled 2021-08-16: qty 2

## 2021-08-16 MED ORDER — VITAMIN E 45 MG (100 UNIT) PO CAPS
400.0000 [IU] | ORAL_CAPSULE | Freq: Every day | ORAL | Status: DC
Start: 2021-08-16 — End: 2021-08-18
  Administered 2021-08-16 – 2021-08-18 (×3): 400 [IU] via ORAL
  Filled 2021-08-16 (×3): qty 4

## 2021-08-16 MED ORDER — DIPHENHYDRAMINE-APAP (SLEEP) 25-500 MG PO TABS: 2.0000 | ORAL_TABLET | Freq: Every evening | ORAL | Status: DC | PRN

## 2021-08-16 MED ORDER — ACETAMINOPHEN 325 MG PO TABS: 650.0000 mg | ORAL_TABLET | Freq: Four times a day (QID) | ORAL | Status: DC | PRN

## 2021-08-16 MED ORDER — CYCLOBENZAPRINE HCL 10 MG PO TABS
10.0000 mg | ORAL_TABLET | Freq: Three times a day (TID) | ORAL | Status: DC | PRN
  Administered 2021-08-17: 01:00:00 10 mg via ORAL
  Filled 2021-08-16: qty 1

## 2021-08-16 MED ORDER — BUPROPION HCL ER (SR) 150 MG PO TB12
150.0000 mg | ORAL_TABLET | Freq: Every day | ORAL | Status: DC
  Administered 2021-08-16 – 2021-08-18 (×3): 150 mg via ORAL
  Filled 2021-08-16 (×4): qty 1

## 2021-08-16 MED ORDER — HYDROMORPHONE HCL 1 MG/ML IJ SOLN
0.5000 mg | INTRAMUSCULAR | Status: DC | PRN
  Administered 2021-08-16 (×3): 1 mg via INTRAVENOUS
  Administered 2021-08-16: 09:00:00 0.5 mg via INTRAVENOUS
  Administered 2021-08-16 – 2021-08-17 (×5): 1 mg via INTRAVENOUS
  Filled 2021-08-16 (×9): qty 1

## 2021-08-16 MED ORDER — ACETAMINOPHEN 650 MG RE SUPP: 650.0000 mg | Freq: Four times a day (QID) | RECTAL | Status: DC | PRN

## 2021-08-16 MED ORDER — DIPHENHYDRAMINE HCL 25 MG PO CAPS: 25.0000 mg | ORAL_CAPSULE | Freq: Every evening | ORAL | Status: DC | PRN

## 2021-08-16 MED ORDER — KETOROLAC TROMETHAMINE 30 MG/ML IJ SOLN
30.0000 mg | Freq: Three times a day (TID) | INTRAMUSCULAR | Status: AC
  Administered 2021-08-16 – 2021-08-17 (×3): 30 mg via INTRAVENOUS
  Filled 2021-08-16 (×3): qty 1

## 2021-08-16 MED ORDER — HYDROXYZINE HCL 25 MG PO TABS: 25.0000 mg | ORAL_TABLET | Freq: Four times a day (QID) | ORAL | Status: DC | PRN

## 2021-08-16 MED ORDER — GADOBUTROL 1 MMOL/ML IV SOLN
10.0000 mL | Freq: Once | INTRAVENOUS | Status: AC | PRN
  Administered 2021-08-16: 02:00:00 10 mL via INTRAVENOUS

## 2021-08-16 MED ORDER — POLYVINYL ALCOHOL 1.4 % OP SOLN: Freq: Three times a day (TID) | OPHTHALMIC | Status: DC | PRN

## 2021-08-16 MED ORDER — ONDANSETRON HCL 4 MG/2ML IJ SOLN: 4.0000 mg | Freq: Four times a day (QID) | INTRAMUSCULAR | Status: DC | PRN

## 2021-08-16 MED ORDER — ACETAMINOPHEN 500 MG PO TABS: 500.0000 mg | ORAL_TABLET | Freq: Every evening | ORAL | Status: DC | PRN

## 2021-08-16 NOTE — Evaluation (Signed)
Physical Therapy Evaluation & Discharge Patient Details Name: Juan Long MRN: [id number] DOB: 1976/05/19 Today's Date: 08/16/2021  History of Present Illness  Pt is a 45 y.o. male who presented 08/15/21 with worsening back pain. MRI showed a small enhancing fluid collection but no evidence of discitis or osteomyelitis. CT scan suggested the same. Of note, pt s/p PLIF 3-4 on 07/20/21 due to severe adjacent level stenosis L3-4 above previous L4-S1 fusion. PMHx: arthritis, depression, essential hypertension, sleep apnea, 3 previous back surgeries, 2019 shoulder arthroplasty   Clinical Impression  Pt presents with condition above. Pt appears to be at his baseline functionally, moving at a mod I level today, displaying WFL and appropriate strength, sensation, and balance. Limited gait distance due to not having back brace here today. Pt does report noncompliance with using his back brace at home and was informed to have it brought to hospital for longer distance mobility with nursing. Pt also with mild noncompliance to spinal precautions intermittently, but is able to recall all precautions and display appropriate techniques for mobility without breaking precautions. All education completed and questions answered. PT will sign off.     Recommendations for follow up therapy are one component of a multi-disciplinary discharge planning process, led by the attending physician.  Recommendations may be updated based on patient status, additional functional criteria and insurance authorization.  Follow Up Recommendations No PT follow up    Assistance Recommended at Discharge None  Functional Status Assessment Patient has not had a recent decline in their functional status  Equipment Recommendations  None recommended by PT    Recommendations for Other Services       Precautions / Restrictions Precautions Precautions: Back Precaution Booklet Issued: Yes (comment) (during prior  admission) Precaution Comments: reviewed BLT, pt able to reacll without cues Required Braces or Orthoses: Spinal Brace (not present today, educated pt to bring for this admission) Spinal Brace: Lumbar corset;Applied in sitting position Restrictions Weight Bearing Restrictions: No      Mobility  Bed Mobility Overal bed mobility: Modified Independent             General bed mobility comments: Pt displays good spinal precautions compliance without needing assistance to log roll and transition sidelying <> sitting EOB.    Transfers Overall transfer level: Modified independent Equipment used: Straight cane               General transfer comment: Pt able to transfer sit <> stand mod I wiht SPC without LOB.    Ambulation/Gait Ambulation/Gait assistance: Modified independent (Device/Increase time) Gait Distance (Feet): 60 Feet Assistive device: Straight cane Gait Pattern/deviations: Step-through pattern;Decreased stride length Gait velocity: reduced Gait velocity interpretation: >2.62 ft/sec, indicative of community ambulatory   General Gait Details: Pt with slightly slowed but steady gait with SPC, limited distance due to pt not having back brace present today, reporting he sometimes does not wear it at home. Educated pt to haveit brought to hospital for longer distance mobility with nursing. No LOB.  Stairs            Wheelchair Mobility    Modified Rankin (Stroke Patients Only)       Balance Overall balance assessment: No apparent balance deficits (not formally assessed)                                           Pertinent Vitals/Pain Pain Assessment:  0-10 Pain Score: 5  Pain Location: back Pain Descriptors / Indicators: Discomfort;Grimacing;Guarding Pain Intervention(s): Limited activity within patient's tolerance;Monitored during session;Repositioned;Patient requesting pain meds-RN notified    Home Living Family/patient expects to  be discharged to:: Private residence Living Arrangements: Spouse/significant other Available Help at Discharge: Family;Available 24 hours/day Type of Home: House Home Access: Stairs to enter Entrance Stairs-Rails: None Entrance Stairs-Number of Steps: 2   Home Layout: One level Home Equipment: Cane - single point;Rollator (4 wheels) Additional Comments: wife works from home    Prior Function Prior Level of Function : Independent/Modified Independent             Mobility Comments: intermittently ambulated with [facility] or rollator ADLs Comments: indep, able to assume figure four position     Hand Dominance   Dominant Hand: Right    Extremity/Trunk Assessment   Upper Extremity Assessment Upper Extremity Assessment: Overall WFL for tasks assessed    Lower Extremity Assessment Lower Extremity Assessment: Overall WFL for tasks assessed (MMT scores of 4+ to 5 bil grossly; denies numbness/tingling bil)    Cervical / Trunk Assessment Cervical / Trunk Assessment: Back Surgery  Communication   Communication: No difficulties  Cognition Arousal/Alertness: Awake/alert Behavior During Therapy: WFL for tasks assessed/performed Overall Cognitive Status: Within Functional Limits for tasks assessed                                          General Comments General comments (skin integrity, edema, etc.): Needs x1 cue not to twist when sitting EOB otherwise compliant with spinal precautions    Exercises     Assessment/Plan    PT Assessment Patient does not need any further PT services  PT Problem List         PT Treatment Interventions      PT Goals (Current goals can be found in the Care Plan section)  Acute Rehab PT Goals Patient Stated Goal: to feel better PT Goal Formulation: All assessment and education complete, DC therapy Time For Goal Achievement: 08/17/21 Potential to Achieve Goals: Good    Frequency     Barriers to discharge         Co-evaluation               AM-PAC PT "6 Clicks" Mobility  Outcome Measure Help needed turning from your back to your side while in a flat bed without using bedrails?: None Help needed moving from lying on your back to sitting on the side of a flat bed without using bedrails?: None Help needed moving to and from a bed to a chair (including a wheelchair)?: None Help needed standing up from a chair using your arms (e.g., wheelchair or bedside chair)?: None Help needed to walk in hospital room?: None Help needed climbing 3-5 steps with a railing? : None 6 Click Score: 24    End of Session   Activity Tolerance: Patient tolerated treatment well Patient left: in bed;with call bell/phone within reach Nurse Communication: Mobility status PT Visit Diagnosis: Other abnormalities of gait and mobility (R26.89);Difficulty in walking, not elsewhere classified (R26.2);Pain Pain - part of body:  (back)    Time: 2952-8413 PT Time Calculation (min) (ACUTE ONLY): 8 min   Charges:   PT Evaluation $PT Eval Low Complexity: 1 Low          Raymond Gurney, PT, DPT Acute Rehabilitation Services  Pager: [phone] Office:  [phone]   Henrene Dodge Pettis 08/16/2021, 11:03 AM

## 2021-08-16 NOTE — Plan of Care (Signed)
Pt arrived on the unit. A/O x4, RA, C/O back pain. Dilaudid given. Bed in low position, alarms are on, call bell in reach.  Belongings at the bedside(clothes, cane, cell phone and charger)  Family is aware of arriving to the unit

## 2021-08-16 NOTE — H&P (Signed)
Subjective: Patient is a 45 y.o. male who complains of dark intermittent back pain with certain movements. Onset of symptoms was a few days ago, gradually worsening since that time.  Onset was not related to no known injury. The pain is rated severe, and is located at the across the lower back. The pain is described as sharp and occurs intermittently. The symptoms has been progressive. Symptoms are exacerbated by standing and twisting. The patient has tried hydrocodone muscle relaxants. MRI showed a small enhancing fluid collection but no evidence of discitis or osteomyelitis.  CT scan suggested the same.  The surgical site actually looks quite good.  CRP was 3.7, sed rate 16 but his white count was 24 with a left shift.  He has had some nausea.  No headache.  No fevers or chills.  He is now 4 weeks status post L3-4 fusion  Past Medical History:  Diagnosis Date   Arthritis    Depression    Esophageal reflux    Essential hypertension, benign    many years ago   Obstructive sleep apnea (adult) (pediatric)    OSA on CPAP    Spondylisthesis     Past Surgical History:  Procedure Laterality Date   ANTERIOR CERVICAL DECOMP/DISCECTOMY FUSION Right 01/04/2018   Procedure: ANTERIOR CERVICAL DECOMPRESSION FUSION CERVICAL FIVE-SIX, CERVICAL SIX-SEVEN WITH INSTRUMENTATION AND ALLOGRAFT;  Surgeon: Phylliss Bob, MD;  Location: [place];  Service: Orthopedics;  Laterality: Right;   BACK SURGERY     CERVICAL SPINE SURGERY     LUMBAR LAMINECTOMY/DECOMPRESSION MICRODISCECTOMY Right 11/04/2020   Procedure: SI Joint Fusion;  Surgeon: Karsten Ro, DO;  Location: [place];  Service: Neurosurgery;  Laterality: Right;   MULTIPLE TOOTH EXTRACTIONS     NASAL SEPTUM SURGERY     SACROILIAC JOINT FUSION Left 02/27/2021   Procedure: Left sacroiliac joint fusion;  Surgeon: Dawley, Theodoro Doing, DO;  Location: [place];  Service: Neurosurgery;  Laterality: Left;   SHOULDER ARTHROSCOPY Right 2019   TONSILLECTOMY     uvula shaved      VARICOCELE EXCISION     WISDOM TOOTH EXTRACTION      Allergies  Allergen Reactions   Lamotrigine Rash    Eruption, Chill    Social History   Tobacco Use   Smoking status: Former    Types: Cigarettes   Smokeless tobacco: Former   Tobacco comments:    quit 2000  Substance Use Topics   Alcohol use: Yes    Comment: occasional    Family History  Problem Relation Age of Onset   Heart attack Father    Prior to Admission medications   Medication Sig Start Date End Date Taking? Authorizing Provider  aspirin EC 81 MG tablet Take 1 tablet (81 mg total) by mouth daily. 11/26/20  Yes Dawley, Troy C, DO  buPROPion (WELLBUTRIN SR) 150 MG 12 hr tablet Take 150 mg by mouth daily.   Yes [provider]  Carboxymethylcellulose Sodium (LUBRICANT EYE DROPS OP) Place 1 drop into both eyes 3 (three) times daily as needed (dry eyes).   Yes [provider]  cyclobenzaprine (FLEXERIL) 10 MG tablet Take 1 tablet (10 mg total) by mouth 3 (three) times daily as needed for muscle spasms. 07/21/21  Yes Meyran, Ocie Cornfield, NP  diphenhydramine-acetaminophen (TYLENOL PM) 25-500 MG TABS tablet Take 2 tablets by mouth at bedtime as needed (sleep).   Yes [provider]  FLUoxetine (PROZAC) 20 MG tablet Take 60 mg by mouth daily.   Yes  [provider]  HYDROcodone-acetaminophen (NORCO) 10-325 MG tablet Take 1 tablet by mouth every 4 (four) hours as needed for moderate pain ((score 4 to 6)). 07/21/21  Yes Meyran, Tiana Loft, NP  hydrOXYzine (VISTARIL) 25 MG capsule Take 25 mg by mouth every 6 (six) hours as needed for itching. 08/06/21  Yes [provider]  VITAMIN D PO Take 1 capsule by mouth daily.   Yes [provider]  vitamin E 180 MG (400 UNITS) capsule Take 400 Units by mouth daily.   Yes [provider]  famotidine (PEPCID) 40 MG tablet Take 40 mg by mouth See admin instructions. Qd x 5 days Patient not taking: Reported on 08/15/2021  08/06/21   [provider]  predniSONE (DELTASONE) 20 MG tablet Take 40 mg by mouth See admin instructions. Qd x 5 days Patient not taking: Reported on 08/15/2021 08/06/21   [provider]  traMADol (ULTRAM) 50 MG tablet Take 1 tablet (50 mg total) by mouth every 6 (six) hours as needed. Patient not taking: Reported on 07/10/2021 11/05/20 11/05/21  Dawley, Alan Mulder, DO     Review of Systems  Positive ROS: Negative  All other systems have been reviewed and were otherwise negative with the exception of those mentioned in the HPI and as above.  Objective: Vital signs in last 24 hours: Temp:  [97.4 F (36.3 C)-98.4 F (36.9 C)] 97.4 F (36.3 C) (11/27 0836) Pulse Rate:  [66-105] 84 (11/27 1000) Resp:  [15-18] 15 (11/27 0642) BP: (117-129)/(79-89) 117/79 (11/27 0906) SpO2:  [93 %-98 %] 97 % (11/27 1000) Weight:  [111.6 kg] 111.6 kg (11/26 2052)  General Appearance: Alert, cooperative, no distress, appears stated age Head: Normocephalic, without obvious abnormality, atraumatic Eyes: PERRL, conjunctiva/corneas clear, EOM's intact    Ears: Normal TM's and external ear canals, both ears Throat: Lips, mucosa, and tongue normal; teeth and gums normal Neck: Supple, symmetrical, trachea midline Back: Symmetric, no curvature, ROM normal, no CVA tenderness, incision is pristine with no evidence of redness or induration or drainage or tenderness Lungs: Clear to auscultation bilaterally, respirations unlabored Heart: Regular rate and rhythm Abdomen: Soft, non-tender, Extremities: Extremities normal, atraumatic, no cyanosis or edema Pulses: 2+ and symmetric all extremities Skin: Skin color, texture, turgor normal, no rashes or lesions  NEUROLOGIC:   Mental status: alert and oriented, no aphasia, good attention span, Fund of knowledge/ memory ok Motor Exam - grossly normal Sensory Exam - grossly normal Reflexes:  Coordination - grossly normal Gait - grossly normal Balance -  grossly normal Cranial Nerves: I: smell Not tested  II: visual acuity  OS: na    OD: na  II: visual fields Full to confrontation  II: pupils Equal, round, reactive to light  III,VII: ptosis None  III,IV,VI: extraocular muscles  Full ROM  V: mastication Normal  V: facial light touch sensation  Normal  V,VII: corneal reflex  Present  VII: facial muscle function - upper  Normal  VII: facial muscle function - lower Normal  VIII: hearing Not tested  IX: soft palate elevation  Normal  IX,X: gag reflex Present  XI: trapezius strength  5/5  XI: sternocleidomastoid strength 5/5  XI: neck flexion strength  5/5  XII: tongue strength  Normal    Data Review Lab Results  Component Value Date   WBC 24.1 (H) 08/15/2021   HGB 14.8 08/15/2021   HCT 46.4 08/15/2021   MCV 90.4 08/15/2021   PLT 583 (H) 08/15/2021   Lab Results  Component  Value Date   NA 137 08/15/2021   K 4.2 08/15/2021   CL 103 08/15/2021   CO2 24 08/15/2021   BUN 13 08/15/2021   CREATININE 1.01 08/15/2021   GLUCOSE 100 (H) 08/15/2021   Lab Results  Component Value Date   INR 0.9 07/16/2021    Assessment/Plan: Unclear etiology of his sharp back pain.  His wound is pristine and nontender and I think his imaging looks like it is expected to look 4 weeks after lumbar fusion, and his sed rate is only 16, but his CRP is slightly elevated and he has a white count with a left shift.  In my experience, even with grossly infected wounds we do not typically see a high white count with a left shift.  And he certainly does not look septic.  He did just finish steroids in the last week which were taken for hives.  To admitting for pain control.  Repeat CBC tomorrow morning.  Keep an eye on the wound to inner thigh signs of infection   [name] 08/16/2021 10:27 AM

## 2021-08-16 NOTE — Progress Notes (Signed)
OT Cancellation Note  Patient Details Name: Juan Long MRN: [id number] DOB: [birth year]   Cancelled Treatment:    Reason Eval/Treat Not Completed: OT screened, no needs identified, will sign off.   Spoke with PT and pt.  He reports he is able to manage ADLs mod I, and doesn't feel he needs OT.  Eber Jones., OTR/L Acute Rehabilitation Services Pager [phone] Office [phone]   [name] M 08/16/2021, 12:23 PM

## 2021-08-16 NOTE — ED Notes (Signed)
Patient is resting comfortably. 

## 2021-08-16 NOTE — ED Provider Notes (Signed)
[facility] EMERGENCY DEPARTMENT Provider Note   CSN: [id number] Arrival date & time: 08/15/21  1910     History Chief Complaint  Patient presents with   Back Pain    Juan Long is a 45 y.o. male.  The history is provided by the patient and the spouse.  Back Pain Location:  Lumbar spine Quality:  Aching Radiates to:  Does not radiate Pain severity:  Moderate Onset quality:  Gradual Duration:  1 week Timing:  Constant Progression:  Worsening Chronicity:  New Context comment:  Surgery Relieved by:  Nothing Worsened by:  Nothing Associated symptoms: no abdominal pain, no bladder incontinence, no bowel incontinence, no dysuria, no fever, no numbness and no weakness   Risk factors: recent surgery   Patient reports recent back surgery.  He reports he did well after the surgery, but over the past week his been having deep pain around where his surgery took place.  No leg weakness.  No incontinence.  No fevers or chills.  It is not responding to home medications.  He is ambulatory.     Past Medical History:  Diagnosis Date   Arthritis    Depression    Esophageal reflux    Essential hypertension, benign    many years ago   Obstructive sleep apnea (adult) (pediatric)    OSA on CPAP    Spondylisthesis     Patient Active Problem List   Diagnosis Date Noted   Postoperative pain after spinal surgery 08/16/2021   Sacroiliitis ([place]) 11/04/2020   S/P lumbar fusion 12/05/2019   Radiculopathy 01/04/2018    Past Surgical History:  Procedure Laterality Date   ANTERIOR CERVICAL DECOMP/DISCECTOMY FUSION Right 01/04/2018   Procedure: ANTERIOR CERVICAL DECOMPRESSION FUSION CERVICAL FIVE-SIX, CERVICAL SIX-SEVEN WITH INSTRUMENTATION AND ALLOGRAFT;  Surgeon: Phylliss Bob, MD;  Location: [place];  Service: Orthopedics;  Laterality: Right;   BACK SURGERY     CERVICAL SPINE SURGERY     LUMBAR LAMINECTOMY/DECOMPRESSION MICRODISCECTOMY Right 11/04/2020    Procedure: SI Joint Fusion;  Surgeon: Karsten Ro, DO;  Location: [place];  Service: Neurosurgery;  Laterality: Right;   MULTIPLE TOOTH EXTRACTIONS     NASAL SEPTUM SURGERY     SACROILIAC JOINT FUSION Left 02/27/2021   Procedure: Left sacroiliac joint fusion;  Surgeon: Dawley, Theodoro Doing, DO;  Location: [place];  Service: Neurosurgery;  Laterality: Left;   SHOULDER ARTHROSCOPY Right 2019   TONSILLECTOMY     uvula shaved     VARICOCELE EXCISION     WISDOM TOOTH EXTRACTION         Family History  Problem Relation Age of Onset   Heart attack Father     Social History   Tobacco Use   Smoking status: Former    Types: Cigarettes   Smokeless tobacco: Former   Tobacco comments:    quit 2000  Vaping Use   Vaping Use: Never used  Substance Use Topics   Alcohol use: Yes    Comment: occasional   Drug use: No    Home Medications Prior to Admission medications   Medication Sig Start Date End Date Taking? Authorizing Provider  aspirin EC 81 MG tablet Take 1 tablet (81 mg total) by mouth daily. 11/26/20  Yes Dawley, Troy C, DO  buPROPion (WELLBUTRIN SR) 150 MG 12 hr tablet Take 150 mg by mouth daily.   Yes [provider]  Carboxymethylcellulose Sodium (LUBRICANT EYE DROPS OP) Place 1 drop into both eyes 3 (three) times daily  as needed (dry eyes).   Yes [provider]  cyclobenzaprine (FLEXERIL) 10 MG tablet Take 1 tablet (10 mg total) by mouth 3 (three) times daily as needed for muscle spasms. 07/21/21  Yes Meyran, Ocie Cornfield, NP  diphenhydramine-acetaminophen (TYLENOL PM) 25-500 MG TABS tablet Take 2 tablets by mouth at bedtime as needed (sleep).   Yes [provider]  FLUoxetine (PROZAC) 20 MG tablet Take 60 mg by mouth daily.   Yes [provider]  HYDROcodone-acetaminophen (NORCO) 10-325 MG tablet Take 1 tablet by mouth every 4 (four) hours as needed for moderate pain ((score 4 to 6)). 07/21/21  Yes Meyran, Ocie Cornfield, NP  hydrOXYzine (VISTARIL)  25 MG capsule Take 25 mg by mouth every 6 (six) hours as needed for itching. 08/06/21  Yes [provider]  VITAMIN D PO Take 1 capsule by mouth daily.   Yes [provider]  vitamin E 180 MG (400 UNITS) capsule Take 400 Units by mouth daily.   Yes [provider]  famotidine (PEPCID) 40 MG tablet Take 40 mg by mouth See admin instructions. Qd x 5 days Patient not taking: Reported on 08/15/2021 08/06/21   [provider]  predniSONE (DELTASONE) 20 MG tablet Take 40 mg by mouth See admin instructions. Qd x 5 days Patient not taking: Reported on 08/15/2021 08/06/21   [provider]  traMADol (ULTRAM) 50 MG tablet Take 1 tablet (50 mg total) by mouth every 6 (six) hours as needed. Patient not taking: Reported on 07/10/2021 11/05/20 11/05/21  Dawley, Theodoro Doing, DO    Allergies    Lamotrigine  Review of Systems   Review of Systems  Constitutional:  Negative for chills and fever.  Gastrointestinal:  Positive for nausea. Negative for abdominal pain, bowel incontinence and vomiting.  Genitourinary:  Negative for bladder incontinence and dysuria.  Musculoskeletal:  Positive for back pain.  Neurological:  Negative for weakness and numbness.  All other systems reviewed and are negative.  Physical Exam Updated Vital Signs BP 129/86   Pulse 74   Temp 98.4 F (36.9 C) (Oral)   Resp 15   Ht 1.803 m (5' 11")   Wt 111.6 kg   SpO2 98%   BMI 34.31 kg/m   Physical Exam CONSTITUTIONAL: Well developed/well nourished, uncomfortable appearing HEAD: Normocephalic/atraumatic EYES: EOMI/PERRL ENMT: Mucous membranes moist NECK: supple no meningeal signs SPINE/BACK:entire spine nontender well-healed incision to the lumbar region.  No fluctuance or erythema. CV: S1/S2 noted, no murmurs/rubs/gallops noted LUNGS: Lungs are clear to auscultation bilaterally, no apparent distress ABDOMEN: soft, nontender, no rebound or guarding GU:no cva tenderness NEURO:  Awake/alert,  equal motor 5/5 strength noted with the following: hip flexion/knee flexion/extension, foot dorsi/plantar flexion, great toe extension intact bilaterally, no clonus bilaterally,  no sensory deficit in any dermatome.  Equal patellar reflex noted  in bilateral lower extremities.   EXTREMITIES: pulses normal, full ROM SKIN: warm, color normal PSYCH: no abnormalities of mood noted, alert and oriented to situation  ED Results / Procedures / Treatments   Labs (all labs ordered are listed, but only abnormal results are displayed) Labs Reviewed  URINALYSIS, ROUTINE W REFLEX MICROSCOPIC - Abnormal; Notable for the following components:      Result Value   Color, Urine AMBER (*)    APPearance HAZY (*)    Specific Gravity, Urine 1.038 (*)    Ketones, ur 5 (*)    Protein, ur 30 (*)    All other components within normal limits  CBC  WITH DIFFERENTIAL/PLATELET - Abnormal; Notable for the following components:   WBC 24.1 (*)    Platelets 583 (*)    Neutro Abs 18.9 (*)    Monocytes Absolute 1.2 (*)    Abs Immature Granulocytes 0.17 (*)    All other components within normal limits  COMPREHENSIVE METABOLIC PANEL - Abnormal; Notable for the following components:   Glucose, Bld 100 (*)    All other components within normal limits  C-REACTIVE PROTEIN - Abnormal; Notable for the following components:   CRP 3.7 (*)    All other components within normal limits  RESP PANEL BY RT-PCR (FLU A&B, COVID) [id number]  SEDIMENTATION RATE    EKG None  Radiology CT Lumbar Spine Wo Contrast  Result Date: 08/15/2021 CLINICAL DATA:  Low back pain, new symptoms, most recent fusion approximately 1 month ago EXAM: CT LUMBAR SPINE WITHOUT CONTRAST TECHNIQUE: Multidetector CT imaging of the lumbar spine was performed without intravenous contrast administration. Multiplanar CT image reconstructions were also generated. COMPARISON:  08/06/2020 CT lumbar spine, correlation is made with 07/20/2021 fluoroscopic  images and 06/09/2021 CT pelvis FINDINGS: Segmentation: 5 lumbar type vertebrae. Alignment: Grade 1 anterolisthesis L5 on S1, unchanged. Vertebrae: No acute fracture or suspicious osseous lesion. Redemonstrated L5 pars defects bilaterally. Status post interval extension of previously noted fusion and decompression to the L3-L4 level, now extending from L3 through S1, in addition to bilateral sacroiliac fusion, which is new compared to 08/06/2020 but was present on 06/09/2021. The fusion rods now connect L3 and L4, with no rod seen at L5 or S1, consistent with the 07/20/2021 fluoroscopic images. No evidence of hardware fracture. No perihardware lucency to suggest loosening. Unchanged subsidence of the posteroinferior endplate of L5. Paraspinal and other soft tissues: Postsurgical changes in the soft tissue posterior to the lower lumbar spine, with a small fluid collection posterior to L3-L4 (series 3, image 106), measuring up to 2.4 x 0.9 x 4.3 cm (series 3, image 106 and series 8, image 62), favored to be a postoperative seroma; no significant adjacent stranding. Disc levels: T12-L1: No significant disc bulge. No spinal canal stenosis or neural foraminal narrowing. L1-L2: No significant disc bulge. No spinal canal stenosis or neural foraminal narrowing. L2-L3: No significant disc bulge. No spinal canal stenosis or neural foraminal narrowing. L3-L4: Status post fusion. No residual disc bulge. No spinal canal stenosis or neural foraminal narrowing. L4-L5: Status post fusion and decompression. No spinal canal stenosis or neural foraminal narrowing. L5-S1: Status post fusion and decompression. No spinal canal stenosis. Mild bilateral neural foraminal narrowing, unchanged. IMPRESSION: 1. Status post interval extension of fusion and decompression to the L3-L4 level. No evidence of hardware failure. 2. Small fluid collection in the soft tissues posterior to L3 and L4, without significant adjacent stranding, favored to be a  postoperative seroma, although an infected collection could appear similar. 3. No significant spinal canal stenosis. Mild bilateral neural foraminal narrowing at L5-S1. Electronically Signed   By: Merilyn Baba M.D.   On: 08/15/2021 23:15   MR Lumbar Spine W Wo Contrast  Result Date: 08/16/2021 CLINICAL DATA:  Low back pain, infection suspected, recent surgery. EXAM: MRI LUMBAR SPINE WITHOUT AND WITH CONTRAST TECHNIQUE: Multiplanar and multiecho pulse sequences of the lumbar spine were obtained without and with intravenous contrast. CONTRAST:  72m GADAVIST GADOBUTROL 1 MMOL/ML IV SOLN COMPARISON:  MRI lumbar spine 06/16/2021, correlation is also made with 08/15/2021 CT lumbar spine. FINDINGS: Segmentation:  Standard. Alignment:  Grade 1 anterolisthesis L5 on S1, unchanged. Vertebrae:  Status post interval extension of fusion and decompression to the L3-L4 level. Evaluation of the endplates is somewhat limited by susceptibility artifact; however, within this limitation, there is enhancement in the endplates at Z6-X0, R6-E4, and L5-S1 (series 2, image 9), as well as increased T2 signal, likely indicating edema. No prevertebral collection. Conus medullaris and cauda equina: Conus extends to the L1 level. Conus and cauda equina appear normal. No enhancing collection in the ventral epidural space. Paraspinal and other soft tissues: Fluid collection in the posterior soft tissues, which demonstrates central low density and peripheral enhancement (series 7, image 23). The fluid collection extends along the right aspect of the L4 spinous process (series 5, image 32), which does enhance (series 7, image 32). This does appear to connect to the posterior aspect of the thecal sac, although evaluation is limited by susceptibility artifact from hardware. The deep portion of the collection appears to connect through a tract to the superficial subcutaneous tissues (series 7, image 23 and 32 and series 2, image 9). Disc levels:  T12-L1: No significant disc bulge. No spinal canal stenosis or neural foraminal narrowing. L1-L2: No significant disc bulge. No spinal canal stenosis or neural foraminal narrowing. L2-L3: No significant disc bulge. No spinal canal stenosis or neural foraminal narrowing. L3-L4: Status post fusion. Fat is noted in the ventral epidural space, which causes mild thecal sac narrowing. Evaluation of the neural foramina is limited by susceptibility artifact, however they appear patent. L4-L5: Status post fusion and decompression. No spinal canal stenosis or neural foraminal narrowing. L5-S1: Status post fusion and decompression. No spinal canal stenosis or neural foraminal narrowing. IMPRESSION: 1. Status post interval extension of fusion to L3-L4. Increased T2 signal and enhancement are seen in the endplates at V4-U9, W1-X9, and L5-S1, which is nonspecific and may be related to susceptibility artifact or edema, but is favored not to represent discitis osteomyelitis. No prevertebral or ventral epidural collection. 2. Fluid collection in the posterior soft tissues, which is T1 hypointense, T2 hyperintense, and peripherally enhancing. This may represent a sterile postoperative collection with surrounding edema but is concerning for abscess. The collection tracks from the superficial subcutaneous tissues, through the paraspinous musculature, along the right aspect of the L4 spinous process to closely approximate the posterior margin of the thecal sac. These results were called by telephone at the time of interpretation on 08/16/2021 at 3:03 am to provider Ripley Fraise , who verbally acknowledged these results. Electronically Signed   By: Merilyn Baba M.D.   On: 08/16/2021 03:03    Procedures Procedures   Medications Ordered in ED Medications  fentaNYL (SUBLIMAZE) injection 100 mcg (100 mcg Intravenous Given 08/16/21 0000)  ondansetron (ZOFRAN) injection 4 mg (4 mg Intravenous Given 08/16/21 0002)  gadobutrol  (GADAVIST) 1 MMOL/ML injection 10 mL (10 mLs Intravenous Contrast Given 08/16/21 0211)  fentaNYL (SUBLIMAZE) injection 100 mcg (100 mcg Intravenous Given 08/16/21 0256)    ED Course  I have reviewed the triage vital signs and the nursing notes.  Pertinent labs & imaging results that were available during my care of the patient were reviewed by me and considered in my medical decision making (see chart for details).    MDM Rules/Calculators/A&P                           3:24 AM Patient underwent decompressive lumbar laminectomy, lumbar interbody fusion and fixation on October 31.  He reports doing well after surgery, but now having pain  in the past week.  Screening CT scan of the spine did not reveal any obvious complications, but patient still reports pain.  He does have significant leukocytosis but denies fevers.  Discussed the case with on-call neurosurgery who recommends emergent MRI lumbar spine with and without contrast as well as CRP and ESR Patient reports he is able to perform an MRI as he just had one for his shoulder. 3:24 AM Discussed MRI results w/ radiology I had already spoken to neurosurgery who plans to admit. Patient stable in the ER  Final Clinical Impression(s) / ED Diagnoses Final diagnoses:  Postoperative pain after spinal surgery    Rx / DC Orders ED Discharge Orders     None        Ripley Fraise, MD 08/16/21 [phone]

## 2021-08-16 NOTE — ED Notes (Signed)
Breakfast Ordered 

## 2021-08-17 DIAGNOSIS — Z8249 Family history of ischemic heart disease and other diseases of the circulatory system: Secondary | ICD-10-CM | POA: Diagnosis not present

## 2021-08-17 DIAGNOSIS — K219 Gastro-esophageal reflux disease without esophagitis: Secondary | ICD-10-CM | POA: Diagnosis present

## 2021-08-17 DIAGNOSIS — Z888 Allergy status to other drugs, medicaments and biological substances status: Secondary | ICD-10-CM | POA: Diagnosis not present

## 2021-08-17 DIAGNOSIS — Z79899 Other long term (current) drug therapy: Secondary | ICD-10-CM | POA: Diagnosis not present

## 2021-08-17 DIAGNOSIS — Z20822 Contact with and (suspected) exposure to covid-19: Secondary | ICD-10-CM | POA: Diagnosis present

## 2021-08-17 DIAGNOSIS — Z981 Arthrodesis status: Secondary | ICD-10-CM | POA: Diagnosis not present

## 2021-08-17 DIAGNOSIS — M549 Dorsalgia, unspecified: Secondary | ICD-10-CM | POA: Diagnosis present

## 2021-08-17 DIAGNOSIS — I10 Essential (primary) hypertension: Secondary | ICD-10-CM | POA: Diagnosis present

## 2021-08-17 DIAGNOSIS — M1990 Unspecified osteoarthritis, unspecified site: Secondary | ICD-10-CM | POA: Diagnosis present

## 2021-08-17 DIAGNOSIS — G4733 Obstructive sleep apnea (adult) (pediatric): Secondary | ICD-10-CM | POA: Diagnosis present

## 2021-08-17 DIAGNOSIS — Z7982 Long term (current) use of aspirin: Secondary | ICD-10-CM | POA: Diagnosis not present

## 2021-08-17 DIAGNOSIS — Z87891 Personal history of nicotine dependence: Secondary | ICD-10-CM | POA: Diagnosis not present

## 2021-08-17 DIAGNOSIS — G8918 Other acute postprocedural pain: Secondary | ICD-10-CM | POA: Diagnosis present

## 2021-08-17 LAB — CBC WITH DIFFERENTIAL/PLATELET
Abs Immature Granulocytes: 0.1 10*3/uL — ABNORMAL HIGH (ref 0.00–0.07)
Basophils Absolute: 0 10*3/uL (ref 0.0–0.1)
Basophils Relative: 0 %
Eosinophils Absolute: 0 10*3/uL (ref 0.0–0.5)
Eosinophils Relative: 0 %
HCT: 43 % (ref 39.0–52.0)
Hemoglobin: 13.9 g/dL (ref 13.0–17.0)
Immature Granulocytes: 1 %
Lymphocytes Relative: 9 %
Lymphs Abs: 1.7 10*3/uL (ref 0.7–4.0)
MCH: 29.3 pg (ref 26.0–34.0)
MCHC: 32.3 g/dL (ref 30.0–36.0)
MCV: 90.7 fL (ref 80.0–100.0)
Monocytes Absolute: 0.7 10*3/uL (ref 0.1–1.0)
Monocytes Relative: 4 %
Neutro Abs: 15.4 10*3/uL — ABNORMAL HIGH (ref 1.7–7.7)
Neutrophils Relative %: 86 %
Platelets: 482 10*3/uL — ABNORMAL HIGH (ref 150–400)
RBC: 4.74 MIL/uL (ref 4.22–5.81)
RDW: 14.1 % (ref 11.5–15.5)
WBC: 17.8 10*3/uL — ABNORMAL HIGH (ref 4.0–10.5)
nRBC: 0 % (ref 0.0–0.2)

## 2021-08-17 MED ORDER — KETOROLAC TROMETHAMINE 30 MG/ML IJ SOLN
30.0000 mg | Freq: Three times a day (TID) | INTRAMUSCULAR | Status: AC
  Administered 2021-08-17 – 2021-08-18 (×3): 30 mg via INTRAVENOUS
  Filled 2021-08-17 (×3): qty 1

## 2021-08-17 NOTE — Plan of Care (Signed)

## 2021-08-17 NOTE — Progress Notes (Signed)
Subjective: Patient reports doing well, pain improving  Objective: Vital signs in last 24 hours: Temp:  [97.4 F (36.3 C)-98 F (36.7 C)] 97.5 F (36.4 C) (11/28 0730) Pulse Rate:  [63-88] 63 (11/28 0730) Resp:  [15-16] 15 (11/28 0730) BP: (114-134)/(74-86) 124/83 (11/28 0730) SpO2:  [93 %-97 %] 97 % (11/28 0730) Weight:  [id number] kg] 112 kg (11/27 1430)  Intake/Output from previous day: 11/27 0701 - 11/28 0700 In: 240 [P.O.:240] Out: 1500 [Urine:1500] Intake/Output this shift: No intake/output data recorded.  Neurologic: Grossly normal  Lab Results: Lab Results  Component Value Date   WBC 17.8 (H) 08/17/2021   HGB 13.9 08/17/2021   HCT 43.0 08/17/2021   MCV 90.7 08/17/2021   PLT 482 (H) 08/17/2021   Lab Results  Component Value Date   INR 0.9 07/16/2021   BMET Lab Results  Component Value Date   NA 137 08/15/2021   K 4.2 08/15/2021   CL 103 08/15/2021   CO2 24 08/15/2021   GLUCOSE 100 (H) 08/15/2021   BUN 13 08/15/2021   CREATININE 1.01 08/15/2021   CALCIUM 9.6 08/15/2021    Studies/Results: CT Lumbar Spine Wo Contrast  Result Date: 08/15/2021 CLINICAL DATA:  Low back pain, new symptoms, most recent fusion approximately 1 month ago EXAM: CT LUMBAR SPINE WITHOUT CONTRAST TECHNIQUE: Multidetector CT imaging of the lumbar spine was performed without intravenous contrast administration. Multiplanar CT image reconstructions were also generated. COMPARISON:  08/06/2020 CT lumbar spine, correlation is made with 07/20/2021 fluoroscopic images and 06/09/2021 CT pelvis FINDINGS: Segmentation: 5 lumbar type vertebrae. Alignment: Grade 1 anterolisthesis L5 on S1, unchanged. Vertebrae: No acute fracture or suspicious osseous lesion. Redemonstrated L5 pars defects bilaterally. Status post interval extension of previously noted fusion and decompression to the L3-L4 level, now extending from L3 through S1, in addition to bilateral sacroiliac fusion, which is new compared to  08/06/2020 but was present on 06/09/2021. The fusion rods now connect L3 and L4, with no rod seen at L5 or S1, consistent with the 07/20/2021 fluoroscopic images. No evidence of hardware fracture. No perihardware lucency to suggest loosening. Unchanged subsidence of the posteroinferior endplate of L5. Paraspinal and other soft tissues: Postsurgical changes in the soft tissue posterior to the lower lumbar spine, with a small fluid collection posterior to L3-L4 (series 3, image 106), measuring up to 2.4 x 0.9 x 4.3 cm (series 3, image 106 and series 8, image 62), favored to be a postoperative seroma; no significant adjacent stranding. Disc levels: T12-L1: No significant disc bulge. No spinal canal stenosis or neural foraminal narrowing. L1-L2: No significant disc bulge. No spinal canal stenosis or neural foraminal narrowing. L2-L3: No significant disc bulge. No spinal canal stenosis or neural foraminal narrowing. L3-L4: Status post fusion. No residual disc bulge. No spinal canal stenosis or neural foraminal narrowing. L4-L5: Status post fusion and decompression. No spinal canal stenosis or neural foraminal narrowing. L5-S1: Status post fusion and decompression. No spinal canal stenosis. Mild bilateral neural foraminal narrowing, unchanged. IMPRESSION: 1. Status post interval extension of fusion and decompression to the L3-L4 level. No evidence of hardware failure. 2. Small fluid collection in the soft tissues posterior to L3 and L4, without significant adjacent stranding, favored to be a postoperative seroma, although an infected collection could appear similar. 3. No significant spinal canal stenosis. Mild bilateral neural foraminal narrowing at L5-S1. Electronically Signed   By: Merilyn Baba M.D.   On: 08/15/2021 23:15   MR Lumbar Spine W Wo Contrast  Result Date:  08/16/2021 CLINICAL DATA:  Low back pain, infection suspected, recent surgery. EXAM: MRI LUMBAR SPINE WITHOUT AND WITH CONTRAST TECHNIQUE:  Multiplanar and multiecho pulse sequences of the lumbar spine were obtained without and with intravenous contrast. CONTRAST:  36mL GADAVIST GADOBUTROL 1 MMOL/ML IV SOLN COMPARISON:  MRI lumbar spine 06/16/2021, correlation is also made with 08/15/2021 CT lumbar spine. FINDINGS: Segmentation:  Standard. Alignment:  Grade 1 anterolisthesis L5 on S1, unchanged. Vertebrae: Status post interval extension of fusion and decompression to the L3-L4 level. Evaluation of the endplates is somewhat limited by susceptibility artifact; however, within this limitation, there is enhancement in the endplates at L3-L4, L4-L5, and L5-S1 (series 2, image 9), as well as increased T2 signal, likely indicating edema. No prevertebral collection. Conus medullaris and cauda equina: Conus extends to the L1 level. Conus and cauda equina appear normal. No enhancing collection in the ventral epidural space. Paraspinal and other soft tissues: Fluid collection in the posterior soft tissues, which demonstrates central low density and peripheral enhancement (series 7, image 23). The fluid collection extends along the right aspect of the L4 spinous process (series 5, image 32), which does enhance (series 7, image 32). This does appear to connect to the posterior aspect of the thecal sac, although evaluation is limited by susceptibility artifact from hardware. The deep portion of the collection appears to connect through a tract to the superficial subcutaneous tissues (series 7, image 23 and 32 and series 2, image 9). Disc levels: T12-L1: No significant disc bulge. No spinal canal stenosis or neural foraminal narrowing. L1-L2: No significant disc bulge. No spinal canal stenosis or neural foraminal narrowing. L2-L3: No significant disc bulge. No spinal canal stenosis or neural foraminal narrowing. L3-L4: Status post fusion. Fat is noted in the ventral epidural space, which causes mild thecal sac narrowing. Evaluation of the neural foramina is limited by  susceptibility artifact, however they appear patent. L4-L5: Status post fusion and decompression. No spinal canal stenosis or neural foraminal narrowing. L5-S1: Status post fusion and decompression. No spinal canal stenosis or neural foraminal narrowing. IMPRESSION: 1. Status post interval extension of fusion to L3-L4. Increased T2 signal and enhancement are seen in the endplates at L3-L4, L4-L5, and L5-S1, which is nonspecific and may be related to susceptibility artifact or edema, but is favored not to represent discitis osteomyelitis. No prevertebral or ventral epidural collection. 2. Fluid collection in the posterior soft tissues, which is T1 hypointense, T2 hyperintense, and peripherally enhancing. This may represent a sterile postoperative collection with surrounding edema but is concerning for abscess. The collection tracks from the superficial subcutaneous tissues, through the paraspinous musculature, along the right aspect of the L4 spinous process to closely approximate the posterior margin of the thecal sac. These results were called by telephone at the time of interpretation on 08/16/2021 at 3:03 am to provider Zadie Rhine , who verbally acknowledged these results. Electronically Signed   By: Wiliam Ke M.D.   On: 08/16/2021 03:03    Assessment/Plan: Postop pain improving. Will order another cbc for the morning. His WBC is trending down from 24 on admission to 17 today. Continue pain management and ambulating    LOS: 0 days    Tiana Loft [facility] 08/17/2021, 7:38 AM

## 2021-08-18 LAB — CBC WITH DIFFERENTIAL/PLATELET
Abs Immature Granulocytes: 0.07 10*3/uL (ref 0.00–0.07)
Basophils Absolute: 0 10*3/uL (ref 0.0–0.1)
Basophils Relative: 0 %
Eosinophils Absolute: 0.3 10*3/uL (ref 0.0–0.5)
Eosinophils Relative: 3 %
HCT: 40.3 % (ref 39.0–52.0)
Hemoglobin: 12.8 g/dL — ABNORMAL LOW (ref 13.0–17.0)
Immature Granulocytes: 1 %
Lymphocytes Relative: 27 %
Lymphs Abs: 3.5 10*3/uL (ref 0.7–4.0)
MCH: 28.9 pg (ref 26.0–34.0)
MCHC: 31.8 g/dL (ref 30.0–36.0)
MCV: 91 fL (ref 80.0–100.0)
Monocytes Absolute: 0.8 10*3/uL (ref 0.1–1.0)
Monocytes Relative: 6 %
Neutro Abs: 8 10*3/uL — ABNORMAL HIGH (ref 1.7–7.7)
Neutrophils Relative %: 63 %
Platelets: 403 10*3/uL — ABNORMAL HIGH (ref 150–400)
RBC: 4.43 MIL/uL (ref 4.22–5.81)
RDW: 14.4 % (ref 11.5–15.5)
WBC: 12.7 10*3/uL — ABNORMAL HIGH (ref 4.0–10.5)
nRBC: 0 % (ref 0.0–0.2)

## 2021-08-18 NOTE — Progress Notes (Signed)
Pt discharged at this time.  Has all belongings.  All questions answered.

## 2021-08-18 NOTE — Discharge Summary (Signed)
Physician Discharge Summary  Patient ID: Juan Long MRN: [id number] DOB/AGE: Dec 29, 1975 45 y.o.  Admit date: 08/15/2021 Discharge date: 08/18/2021  Admission Diagnoses: postoperative pain   Discharge Diagnoses: same   Discharged Condition: good  Hospital Course: The patient was admitted on 08/15/2021 for postoperative lumbar pain and admitted to the floor. The hospital course was routine. There were no complications. The wound remained clean dry and intact. Pt had appropriate back soreness. No complaints of leg pain or new N/T/W. The patient remained afebrile with stable vital signs, and tolerated a regular diet. The patient continued to increase activities, and pain was well controlled with oral pain medications.   Consults: None  Significant Diagnostic Studies:  Results for orders placed or performed during the hospital encounter of 08/15/21  Resp Panel by RT-PCR (Flu A&B, Covid) Nasopharyngeal Swab   Specimen: Nasopharyngeal Swab; Nasopharyngeal(NP) swabs in vial transport medium  Result Value Ref Range   SARS Coronavirus 2 by RT PCR NEGATIVE NEGATIVE   Influenza A by PCR NEGATIVE NEGATIVE   Influenza B by PCR NEGATIVE NEGATIVE  Urinalysis, Routine w reflex microscopic Urine, Clean Catch  Result Value Ref Range   Color, Urine AMBER (A) YELLOW   APPearance HAZY (A) CLEAR   Specific Gravity, Urine 1.038 (H) 1.005 - 1.030   pH 5.0 5.0 - 8.0   Glucose, UA NEGATIVE NEGATIVE mg/dL   Hgb urine dipstick NEGATIVE NEGATIVE   Bilirubin Urine NEGATIVE NEGATIVE   Ketones, ur 5 (A) NEGATIVE mg/dL   Protein, ur 30 (A) NEGATIVE mg/dL   Nitrite NEGATIVE NEGATIVE   Leukocytes,Ua NEGATIVE NEGATIVE   RBC / HPF 0-5 0 - 5 RBC/hpf   WBC, UA 0-5 0 - 5 WBC/hpf   Bacteria, UA NONE SEEN NONE SEEN   Mucus PRESENT   CBC with Differential/Platelet  Result Value Ref Range   WBC 24.1 (H) 4.0 - 10.5 K/uL   RBC 5.13 4.22 - 5.81 MIL/uL   Hemoglobin 14.8 13.0 - 17.0 g/dL   HCT 46.4 39.0 - 52.0  %   MCV 90.4 80.0 - 100.0 fL   MCH 28.8 26.0 - 34.0 pg   MCHC 31.9 30.0 - 36.0 g/dL   RDW 14.3 11.5 - 15.5 %   Platelets 583 (H) 150 - 400 K/uL   nRBC 0.0 0.0 - 0.2 %   Neutrophils Relative % 78 %   Neutro Abs 18.9 (H) 1.7 - 7.7 K/uL   Lymphocytes Relative 15 %   Lymphs Abs 3.5 0.7 - 4.0 K/uL   Monocytes Relative 5 %   Monocytes Absolute 1.2 (H) 0.1 - 1.0 K/uL   Eosinophils Relative 1 %   Eosinophils Absolute 0.2 0.0 - 0.5 K/uL   Basophils Relative 0 %   Basophils Absolute 0.1 0.0 - 0.1 K/uL   Immature Granulocytes 1 %   Abs Immature Granulocytes 0.17 (H) 0.00 - 0.07 K/uL  Comprehensive metabolic panel  Result Value Ref Range   Sodium 137 135 - 145 mmol/L   Potassium 4.2 3.5 - 5.1 mmol/L   Chloride 103 98 - 111 mmol/L   CO2 24 22 - 32 mmol/L   Glucose, Bld 100 (H) 70 - 99 mg/dL   BUN 13 6 - 20 mg/dL   Creatinine, Ser 1.01 0.61 - 1.24 mg/dL   Calcium 9.6 8.9 - 10.3 mg/dL   Total Protein 7.7 6.5 - 8.1 g/dL   Albumin 4.2 3.5 - 5.0 g/dL   AST 28 15 - 41 U/L   ALT 35 0 -  44 U/L   Alkaline Phosphatase 102 38 - 126 U/L   Total Bilirubin 0.5 0.3 - 1.2 mg/dL   GFR, Estimated >60 >60 mL/min   Anion gap 10 5 - 15  Sedimentation rate  Result Value Ref Range   Sed Rate 16 0 - 16 mm/hr  C-reactive protein  Result Value Ref Range   CRP 3.7 (H) <1.0 mg/dL  CBC with Differential/Platelet  Result Value Ref Range   WBC 17.8 (H) 4.0 - 10.5 K/uL   RBC 4.74 4.22 - 5.81 MIL/uL   Hemoglobin 13.9 13.0 - 17.0 g/dL   HCT 43.0 39.0 - 52.0 %   MCV 90.7 80.0 - 100.0 fL   MCH 29.3 26.0 - 34.0 pg   MCHC 32.3 30.0 - 36.0 g/dL   RDW 14.1 11.5 - 15.5 %   Platelets 482 (H) 150 - 400 K/uL   nRBC 0.0 0.0 - 0.2 %   Neutrophils Relative % 86 %   Neutro Abs 15.4 (H) 1.7 - 7.7 K/uL   Lymphocytes Relative 9 %   Lymphs Abs 1.7 0.7 - 4.0 K/uL   Monocytes Relative 4 %   Monocytes Absolute 0.7 0.1 - 1.0 K/uL   Eosinophils Relative 0 %   Eosinophils Absolute 0.0 0.0 - 0.5 K/uL   Basophils Relative 0 %    Basophils Absolute 0.0 0.0 - 0.1 K/uL   Immature Granulocytes 1 %   Abs Immature Granulocytes 0.10 (H) 0.00 - 0.07 K/uL  CBC with Differential/Platelet  Result Value Ref Range   WBC 12.7 (H) 4.0 - 10.5 K/uL   RBC 4.43 4.22 - 5.81 MIL/uL   Hemoglobin 12.8 (L) 13.0 - 17.0 g/dL   HCT 40.3 39.0 - 52.0 %   MCV 91.0 80.0 - 100.0 fL   MCH 28.9 26.0 - 34.0 pg   MCHC 31.8 30.0 - 36.0 g/dL   RDW 14.4 11.5 - 15.5 %   Platelets 403 (H) 150 - 400 K/uL   nRBC 0.0 0.0 - 0.2 %   Neutrophils Relative % 63 %   Neutro Abs 8.0 (H) 1.7 - 7.7 K/uL   Lymphocytes Relative 27 %   Lymphs Abs 3.5 0.7 - 4.0 K/uL   Monocytes Relative 6 %   Monocytes Absolute 0.8 0.1 - 1.0 K/uL   Eosinophils Relative 3 %   Eosinophils Absolute 0.3 0.0 - 0.5 K/uL   Basophils Relative 0 %   Basophils Absolute 0.0 0.0 - 0.1 K/uL   Immature Granulocytes 1 %   Abs Immature Granulocytes 0.07 0.00 - 0.07 K/uL    DG Lumbar Spine 2-3 Views  Result Date: 07/20/2021 CLINICAL DATA:  Surgical fusion at L3-L4 level EXAM: LUMBAR SPINE - 2-3 VIEW COMPARISON:  06/19/2020 FINDINGS: Fluoroscopic images show interval surgical fusion at L3-L4 level. Intervertebral disc spacer is noted. There is previous surgical fusion from L4-S1 levels. Fluoroscopic time was 38 seconds. IMPRESSION: Fluoroscopic assistance was provided for surgical fusion at L3-L4 level. Electronically Signed   By: Elmer Picker M.D.   On: 07/20/2021 10:55   CT Lumbar Spine Wo Contrast  Result Date: 08/15/2021 CLINICAL DATA:  Low back pain, new symptoms, most recent fusion approximately 1 month ago EXAM: CT LUMBAR SPINE WITHOUT CONTRAST TECHNIQUE: Multidetector CT imaging of the lumbar spine was performed without intravenous contrast administration. Multiplanar CT image reconstructions were also generated. COMPARISON:  08/06/2020 CT lumbar spine, correlation is made with 07/20/2021 fluoroscopic images and 06/09/2021 CT pelvis FINDINGS: Segmentation: 5 lumbar type  vertebrae. Alignment: Grade 1  anterolisthesis L5 on S1, unchanged. Vertebrae: No acute fracture or suspicious osseous lesion. Redemonstrated L5 pars defects bilaterally. Status post interval extension of previously noted fusion and decompression to the L3-L4 level, now extending from L3 through S1, in addition to bilateral sacroiliac fusion, which is new compared to 08/06/2020 but was present on 06/09/2021. The fusion rods now connect L3 and L4, with no rod seen at L5 or S1, consistent with the 07/20/2021 fluoroscopic images. No evidence of hardware fracture. No perihardware lucency to suggest loosening. Unchanged subsidence of the posteroinferior endplate of L5. Paraspinal and other soft tissues: Postsurgical changes in the soft tissue posterior to the lower lumbar spine, with a small fluid collection posterior to L3-L4 (series 3, image 106), measuring up to 2.4 x 0.9 x 4.3 cm (series 3, image 106 and series 8, image 62), favored to be a postoperative seroma; no significant adjacent stranding. Disc levels: T12-L1: No significant disc bulge. No spinal canal stenosis or neural foraminal narrowing. L1-L2: No significant disc bulge. No spinal canal stenosis or neural foraminal narrowing. L2-L3: No significant disc bulge. No spinal canal stenosis or neural foraminal narrowing. L3-L4: Status post fusion. No residual disc bulge. No spinal canal stenosis or neural foraminal narrowing. L4-L5: Status post fusion and decompression. No spinal canal stenosis or neural foraminal narrowing. L5-S1: Status post fusion and decompression. No spinal canal stenosis. Mild bilateral neural foraminal narrowing, unchanged. IMPRESSION: 1. Status post interval extension of fusion and decompression to the L3-L4 level. No evidence of hardware failure. 2. Small fluid collection in the soft tissues posterior to L3 and L4, without significant adjacent stranding, favored to be a postoperative seroma, although an infected collection could appear  similar. 3. No significant spinal canal stenosis. Mild bilateral neural foraminal narrowing at L5-S1. Electronically Signed   By: Merilyn Baba M.D.   On: 08/15/2021 23:15   MR Lumbar Spine W Wo Contrast  Result Date: 08/16/2021 CLINICAL DATA:  Low back pain, infection suspected, recent surgery. EXAM: MRI LUMBAR SPINE WITHOUT AND WITH CONTRAST TECHNIQUE: Multiplanar and multiecho pulse sequences of the lumbar spine were obtained without and with intravenous contrast. CONTRAST:  3mL GADAVIST GADOBUTROL 1 MMOL/ML IV SOLN COMPARISON:  MRI lumbar spine 06/16/2021, correlation is also made with 08/15/2021 CT lumbar spine. FINDINGS: Segmentation:  Standard. Alignment:  Grade 1 anterolisthesis L5 on S1, unchanged. Vertebrae: Status post interval extension of fusion and decompression to the L3-L4 level. Evaluation of the endplates is somewhat limited by susceptibility artifact; however, within this limitation, there is enhancement in the endplates at [id number], 075-GRM, and L5-S1 (series 2, image 9), as well as increased T2 signal, likely indicating edema. No prevertebral collection. Conus medullaris and cauda equina: Conus extends to the L1 level. Conus and cauda equina appear normal. No enhancing collection in the ventral epidural space. Paraspinal and other soft tissues: Fluid collection in the posterior soft tissues, which demonstrates central low density and peripheral enhancement (series 7, image 23). The fluid collection extends along the right aspect of the L4 spinous process (series 5, image 32), which does enhance (series 7, image 32). This does appear to connect to the posterior aspect of the thecal sac, although evaluation is limited by susceptibility artifact from hardware. The deep portion of the collection appears to connect through a tract to the superficial subcutaneous tissues (series 7, image 23 and 32 and series 2, image 9). Disc levels: T12-L1: No significant disc bulge. No spinal canal stenosis or  neural foraminal narrowing. L1-L2: No significant disc bulge. No  spinal canal stenosis or neural foraminal narrowing. L2-L3: No significant disc bulge. No spinal canal stenosis or neural foraminal narrowing. L3-L4: Status post fusion. Fat is noted in the ventral epidural space, which causes mild thecal sac narrowing. Evaluation of the neural foramina is limited by susceptibility artifact, however they appear patent. L4-L5: Status post fusion and decompression. No spinal canal stenosis or neural foraminal narrowing. L5-S1: Status post fusion and decompression. No spinal canal stenosis or neural foraminal narrowing. IMPRESSION: 1. Status post interval extension of fusion to L3-L4. Increased T2 signal and enhancement are seen in the endplates at L3-L4, L4-L5, and L5-S1, which is nonspecific and may be related to susceptibility artifact or edema, but is favored not to represent discitis osteomyelitis. No prevertebral or ventral epidural collection. 2. Fluid collection in the posterior soft tissues, which is T1 hypointense, T2 hyperintense, and peripherally enhancing. This may represent a sterile postoperative collection with surrounding edema but is concerning for abscess. The collection tracks from the superficial subcutaneous tissues, through the paraspinous musculature, along the right aspect of the L4 spinous process to closely approximate the posterior margin of the thecal sac. These results were called by telephone at the time of interpretation on 08/16/2021 at 3:03 am to provider Zadie Rhine , who verbally acknowledged these results. Electronically Signed   By: Wiliam Ke M.D.   On: 08/16/2021 03:03   DG C-Arm 1-60 Min-No Report  Result Date: 07/20/2021 Fluoroscopy was utilized by the requesting physician.  No radiographic interpretation.   DG C-Arm 1-60 Min-No Report  Result Date: 07/20/2021 Fluoroscopy was utilized by the requesting physician.  No radiographic interpretation.   DG C-Arm 1-60  Min-No Report  Result Date: 07/20/2021 Fluoroscopy was utilized by the requesting physician.  No radiographic interpretation.    Antibiotics:  Anti-infectives (From admission, onward)    None       Discharge Exam: Blood pressure 132/89, pulse (!) 59, temperature 98 F (36.7 C), temperature source Oral, resp. rate 16, height 5\' 11"  (1.803 m), weight 112 kg, SpO2 99 %. Neurologic: Grossly normal Ambulaitng well, incision cdi   Discharge Medications:   Allergies as of 08/18/2021       Reactions   Lamotrigine Rash   Eruption, Chill        Medication List     TAKE these medications    aspirin EC 81 MG tablet Take 1 tablet (81 mg total) by mouth daily.   buPROPion 150 MG 12 hr tablet Commonly known as: WELLBUTRIN SR Take 150 mg by mouth daily.   cyclobenzaprine 10 MG tablet Commonly known as: FLEXERIL Take 1 tablet (10 mg total) by mouth 3 (three) times daily as needed for muscle spasms.   diphenhydramine-acetaminophen 25-500 MG Tabs tablet Commonly known as: TYLENOL PM Take 2 tablets by mouth at bedtime as needed (sleep).   famotidine 40 MG tablet Commonly known as: PEPCID Take 40 mg by mouth See admin instructions. Qd x 5 days   FLUoxetine 20 MG tablet Commonly known as: PROZAC Take 60 mg by mouth daily.   HYDROcodone-acetaminophen 10-325 MG tablet Commonly known as: NORCO Take 1 tablet by mouth every 4 (four) hours as needed for moderate pain ((score 4 to 6)).   hydrOXYzine 25 MG capsule Commonly known as: VISTARIL Take 25 mg by mouth every 6 (six) hours as needed for itching.   LUBRICANT EYE DROPS OP Place 1 drop into both eyes 3 (three) times daily as needed (dry eyes).   predniSONE 20 MG tablet Commonly known  as: DELTASONE Take 40 mg by mouth See admin instructions. Qd x 5 days   traMADol 50 MG tablet Commonly known as: Ultram Take 1 tablet (50 mg total) by mouth every 6 (six) hours as needed.   VITAMIN D PO Take 1 capsule by mouth daily.    vitamin E 180 MG (400 UNITS) capsule Take 400 Units by mouth daily.        Disposition: home   Final Dx: postoperative surgical pain. His WBC has trended down is now 12  Discharge Instructions     Call MD for:  difficulty breathing, headache or visual disturbances   Complete by: As directed    Call MD for:  hives   Complete by: As directed    Call MD for:  persistant dizziness or light-headedness   Complete by: As directed    Call MD for:  persistant nausea and vomiting   Complete by: As directed    Call MD for:  redness, tenderness, or signs of infection (pain, swelling, redness, odor or green/yellow discharge around incision site)   Complete by: As directed    Call MD for:  severe uncontrolled pain   Complete by: As directed    Call MD for:  temperature >100.4   Complete by: As directed    Diet - low sodium heart healthy   Complete by: As directed    Driving Restrictions   Complete by: As directed    No driving for 2 weeks, no riding in the car for 1 week   Increase activity slowly   Complete by: As directed    Lifting restrictions   Complete by: As directed    No lifting more than 8 lbs          Signed: Ocie Cornfield Daymion Nazaire 08/18/2021, 8:31 AM

## 2021-08-18 NOTE — TOC Transition Note (Signed)
Transition of Care [facility]) - CM/SW Discharge Note   Patient Details  Name: Juan Long MRN: [id number] Date of Birth: 1976/04/13  Transition of Care [facility]) CM/SW Contact:  Kermit Balo, RN Phone Number: 08/18/2021, 10:30 AM   Clinical Narrative:    Patient discharging home with self care. No f/u per PT/OT and no DME needs.  Pt has transportation home.   Final next level of care: Home/Self Care Barriers to Discharge: No Barriers Identified   Patient Goals and CMS Choice        Discharge Placement                       Discharge Plan and Services                                     Social Determinants of Health (SDOH) Interventions     Readmission Risk Interventions No flowsheet data found.

## 2021-08-19 ENCOUNTER — Other Ambulatory Visit: Payer: Self-pay

## 2021-08-19 ENCOUNTER — Encounter (HOSPITAL_COMMUNITY): Payer: Self-pay | Admitting: Emergency Medicine

## 2021-08-19 ENCOUNTER — Emergency Department (HOSPITAL_COMMUNITY)
Admission: EM | Admit: 2021-08-19 | Discharge: 2021-08-20 | Disposition: A | Discharge status: Home / Self Care | Payer: No Typology Code available for payment source | Attending: Emergency Medicine | Admitting: Emergency Medicine

## 2021-08-19 DIAGNOSIS — I10 Essential (primary) hypertension: Secondary | ICD-10-CM | POA: Diagnosis not present

## 2021-08-19 DIAGNOSIS — Z87891 Personal history of nicotine dependence: Secondary | ICD-10-CM | POA: Insufficient documentation

## 2021-08-19 DIAGNOSIS — M545 Low back pain, unspecified: Secondary | ICD-10-CM | POA: Diagnosis present

## 2021-08-19 DIAGNOSIS — Z7982 Long term (current) use of aspirin: Secondary | ICD-10-CM | POA: Diagnosis not present

## 2021-08-19 LAB — CBC WITH DIFFERENTIAL/PLATELET
Abs Immature Granulocytes: 0.07 10*3/uL (ref 0.00–0.07)
Basophils Absolute: 0 10*3/uL (ref 0.0–0.1)
Basophils Relative: 0 %
Eosinophils Absolute: 0.3 10*3/uL (ref 0.0–0.5)
Eosinophils Relative: 2 %
HCT: 44.1 % (ref 39.0–52.0)
Hemoglobin: 14.3 g/dL (ref 13.0–17.0)
Immature Granulocytes: 1 %
Lymphocytes Relative: 23 %
Lymphs Abs: 3.5 10*3/uL (ref 0.7–4.0)
MCH: 29 pg (ref 26.0–34.0)
MCHC: 32.4 g/dL (ref 30.0–36.0)
MCV: 89.5 fL (ref 80.0–100.0)
Monocytes Absolute: 0.9 10*3/uL (ref 0.1–1.0)
Monocytes Relative: 6 %
Neutro Abs: 10.2 10*3/uL — ABNORMAL HIGH (ref 1.7–7.7)
Neutrophils Relative %: 68 %
Platelets: 478 10*3/uL — ABNORMAL HIGH (ref 150–400)
RBC: 4.93 MIL/uL (ref 4.22–5.81)
RDW: 13.9 % (ref 11.5–15.5)
WBC: 15 10*3/uL — ABNORMAL HIGH (ref 4.0–10.5)
nRBC: 0 % (ref 0.0–0.2)

## 2021-08-19 LAB — BASIC METABOLIC PANEL
Anion gap: 11 (ref 5–15)
BUN: 13 mg/dL (ref 6–20)
CO2: 24 mmol/L (ref 22–32)
Calcium: 9.3 mg/dL (ref 8.9–10.3)
Chloride: 102 mmol/L (ref 98–111)
Creatinine, Ser: 1.16 mg/dL (ref 0.61–1.24)
GFR, Estimated: >60 mL/min (ref >60)
Glucose, Bld: 116 mg/dL — ABNORMAL HIGH (ref 70–99)
Potassium: 3.7 mmol/L (ref 3.5–5.1)
Sodium: 137 mmol/L (ref 135–145)

## 2021-08-19 LAB — LACTIC ACID, PLASMA: Lactic Acid, Venous: 1.5 mmol/L (ref 0.5–1.9)

## 2021-08-19 MED ORDER — OXYCODONE-ACETAMINOPHEN 5-325 MG PO TABS
1.0000 | ORAL_TABLET | Freq: Once | ORAL | Status: AC
  Administered 2021-08-19: 1 via ORAL
  Filled 2021-08-19: qty 1

## 2021-08-19 NOTE — ED Triage Notes (Signed)
Pt here for back pain after having L3-L4 fusion on 10/31. Pt reports nothing has touched his pain, it is constant. Here for pain control

## 2021-08-19 NOTE — ED Provider Notes (Signed)
Emergency Medicine Provider Triage Evaluation Note  Juan Long , a 45 y.o. male  was evaluated in triage.  Pt complains of back pain worsening since he had surgery. He was recently admitted for this and d/c yesterday.  Review of Systems  Positive: Back pain Negative: Numbness, incontinence  Physical Exam  BP (!) 131/94   Pulse (!) 114   Temp 98.6 F (37 C) (Oral)   Resp 20   SpO2 98%  Gen:   Awake, no distress   Resp:  Normal effort  MSK:   Moves extremities without difficulty  Other:  Ttp to the lumbar spine over the incision, incision is well healing, 5/5 strength to the ble  Medical Decision Making  Medically screening exam initiated at 8:07 PM.  Appropriate orders placed.  Juan Long was informed that the remainder of the evaluation will be completed by another provider, this initial triage assessment does not replace that evaluation, and the importance of remaining in the ED until their evaluation is complete.     Rayne Du 08/19/21 2007    Lorre Nick, MD 08/20/21 [phone]

## 2021-08-19 NOTE — ED Notes (Signed)
Pt ambulatory in lobby.  

## 2021-08-20 LAB — C-REACTIVE PROTEIN: CRP: 1.9 mg/dL — ABNORMAL HIGH (ref <1.0)

## 2021-08-20 LAB — SEDIMENTATION RATE: Sed Rate: 15 mm/hr (ref 0–16)

## 2021-08-20 MED ORDER — HYDROMORPHONE HCL 1 MG/ML IJ SOLN
1.0000 mg | Freq: Once | INTRAMUSCULAR | Status: AC
  Administered 2021-08-20: 1 mg via SUBCUTANEOUS
  Filled 2021-08-20: qty 1

## 2021-08-20 MED ORDER — HYDROMORPHONE HCL 2 MG PO TABS: 1.0000 mg | ORAL_TABLET | Freq: Four times a day (QID) | ORAL | 0 refills | Status: AC | PRN

## 2021-08-20 MED ORDER — DEXAMETHASONE SODIUM PHOSPHATE 10 MG/ML IJ SOLN
10.0000 mg | Freq: Once | INTRAMUSCULAR | Status: AC
  Administered 2021-08-20: 10 mg via INTRAMUSCULAR
  Filled 2021-08-20: qty 1

## 2021-08-20 NOTE — ED Notes (Signed)
Pt inquired about pain medicine and treatment plan update. EDP notified

## 2021-08-20 NOTE — Discharge Instructions (Signed)
You have been seen and discharged from the emergency department.  Your blood work showed a mild elevation in white blood cells but this can be reactive.  Otherwise your blood work was normal and reassuring.  Neurosurgery was consulted, they reviewed her work-up.  They recommended Decadron here in the department and prescription for Dilaudid for pain that should be at your pharmacy.  Do not mix this medication with alcohol or other sedating medications. Do not drive or do heavy physical activity until you know how this medication affects you.  It may cause drowsiness.  Follow-up with your neurosurgeon and primary provider for reevaluation and further care. Take home medications as prescribed. If you have any worsening symptoms or further concerns for your health please return to an emergency department for further evaluation.

## 2021-08-20 NOTE — ED Provider Notes (Signed)
[facility] EMERGENCY DEPARTMENT Provider Note   CSN: [id number] Arrival date & time: 08/19/21  1811     History Chief Complaint  Patient presents with   Back Pain    Juan Long is a 45 y.o. male.  HPI  44 year old male with past medical history of recent decompressive lumbar surgery with laminectomy and fusion on 10/31 with Dr. Ronnald Ramp presents emergency department with ongoing lower back pain.  Patient was admitted a couple days ago for ongoing lower back pain.  Had an elevated white count, normal ESR.  MRI showed postsurgical collection versus infection.  He was treated symptomatically and discharged home.  He returns today stating that the lower back pain continues, not resolved with home hydrocodone's that he is been taking.  He denies any neuro symptoms.  No leg weakness/numbness, discoloration.  No incontinence or saddle anesthesia.  Difficulty with ambulation secondary to pain but no ataxia.  Denies any fever.  Past Medical History:  Diagnosis Date   Arthritis    Depression    Esophageal reflux    Essential hypertension, benign    many years ago   Obstructive sleep apnea (adult) (pediatric)    OSA on CPAP    Spondylisthesis     Patient Active Problem List   Diagnosis Date Noted   Back pain 08/17/2021   Postoperative pain after spinal surgery 08/16/2021   Sacroiliitis ([place]) 11/04/2020   S/P lumbar fusion 12/05/2019   Radiculopathy 01/04/2018    Past Surgical History:  Procedure Laterality Date   ANTERIOR CERVICAL DECOMP/DISCECTOMY FUSION Right 01/04/2018   Procedure: ANTERIOR CERVICAL DECOMPRESSION FUSION CERVICAL FIVE-SIX, CERVICAL SIX-SEVEN WITH INSTRUMENTATION AND ALLOGRAFT;  Surgeon: Phylliss Bob, MD;  Location: [place];  Service: Orthopedics;  Laterality: Right;   BACK SURGERY     CERVICAL SPINE SURGERY     LUMBAR LAMINECTOMY/DECOMPRESSION MICRODISCECTOMY Right 11/04/2020   Procedure: SI Joint Fusion;  Surgeon: Karsten Ro, DO;   Location: [place];  Service: Neurosurgery;  Laterality: Right;   MULTIPLE TOOTH EXTRACTIONS     NASAL SEPTUM SURGERY     SACROILIAC JOINT FUSION Left 02/27/2021   Procedure: Left sacroiliac joint fusion;  Surgeon: Dawley, Theodoro Doing, DO;  Location: [place];  Service: Neurosurgery;  Laterality: Left;   SHOULDER ARTHROSCOPY Right 2019   TONSILLECTOMY     uvula shaved     VARICOCELE EXCISION     WISDOM TOOTH EXTRACTION         Family History  Problem Relation Age of Onset   Heart attack Father     Social History   Tobacco Use   Smoking status: Former    Types: Cigarettes   Smokeless tobacco: Former   Tobacco comments:    quit 2000  Vaping Use   Vaping Use: Never used  Substance Use Topics   Alcohol use: Yes    Comment: occasional   Drug use: No    Home Medications Prior to Admission medications   Medication Sig Start Date End Date Taking? Authorizing Provider  aspirin EC 81 MG tablet Take 1 tablet (81 mg total) by mouth daily. 11/26/20  Yes Dawley, Troy C, DO  buPROPion (WELLBUTRIN SR) 150 MG 12 hr tablet Take 150 mg by mouth daily.   Yes [provider]  Carboxymethylcellulose Sodium (LUBRICANT EYE DROPS OP) Place 1 drop into both eyes 3 (three) times daily as needed (dry eyes).   Yes [provider]  cyclobenzaprine (FLEXERIL) 10 MG tablet Take 1 tablet (10 mg  total) by mouth 3 (three) times daily as needed for muscle spasms. 07/21/21  Yes Meyran, Kimberly Hannah, NP  diphenhydramine-acetaminophen (TYLENOL PM) 25-500 MG TABS tablet Take 2 tablets by mouth at bedtime as needed (sleep).   Yes [provider]  FLUoxetine (PROZAC) 20 MG tablet Take 60 mg by mouth daily.   Yes [provider]  HYDROcodone-acetaminophen (NORCO) 10-325 MG tablet Take 1 tablet by mouth every 4 (four) hours as needed for moderate pain ((score 4 to 6)). 07/21/21  Yes Meyran, Kimberly Hannah, NP  hydrOXYzine (VISTARIL) 25 MG capsule Take 25 mg by mouth every 6 (six) hours as  needed for itching. 08/06/21  Yes [provider]  VITAMIN D PO Take 1 capsule by mouth daily.   Yes [provider]  vitamin E 180 MG (400 UNITS) capsule Take 400 Units by mouth daily.   Yes [provider]  traMADol (ULTRAM) 50 MG tablet Take 1 tablet (50 mg total) by mouth every 6 (six) hours as needed. Patient not taking: Reported on 07/10/2021 11/05/20 11/05/21  Dawley, Troy C, DO    Allergies    Lamotrigine  Review of Systems   Review of Systems  Constitutional:  Negative for chills and fever.  HENT:  Negative for congestion.   Eyes:  Negative for visual disturbance.  Respiratory:  Negative for shortness of breath.   Cardiovascular:  Negative for chest pain.  Gastrointestinal:  Negative for abdominal pain, diarrhea and vomiting.  Genitourinary:  Negative for dysuria.       No incontinence  Musculoskeletal:  Positive for back pain.  Skin:  Negative for rash.  Neurological:  Negative for weakness, numbness and headaches.   Physical Exam Updated Vital Signs BP (!) 135/92   Pulse 70   Temp 98.7 F (37.1 C) (Oral)   Resp 18   SpO2 95%   Physical Exam Vitals and nursing note reviewed.  Constitutional:      General: He is not in acute distress.    Appearance: Normal appearance. He is not toxic-appearing.  HENT:     Head: Normocephalic.     Mouth/Throat:     Mouth: Mucous membranes are moist.  Cardiovascular:     Rate and Rhythm: Normal rate.  Pulmonary:     Effort: Pulmonary effort is normal. No respiratory distress.  Abdominal:     Palpations: Abdomen is soft.     Tenderness: There is no abdominal tenderness.  Musculoskeletal:     Comments: Midline lumbar incision is well-healed, clean no discharge or surrounding redness  Skin:    General: Skin is warm.  Neurological:     Mental Status: He is alert and oriented to person, place, and time. Mental status is at baseline.     Motor: No weakness.  Psychiatric:        Mood and Affect: Mood  normal.    ED Results / Procedures / Treatments   Labs (all labs ordered are listed, but only abnormal results are displayed) Labs Reviewed  CBC WITH DIFFERENTIAL/PLATELET - Abnormal; Notable for the following components:      Result Value   WBC 15.0 (*)    Platelets 478 (*)    Neutro Abs 10.2 (*)    All other components within normal limits  BASIC METABOLIC PANEL - Abnormal; Notable for the following components:   Glucose, Bld 116 (*)    All other components within normal limits  CULTURE, BLOOD (ROUTINE X 2)  CULTURE, BLOOD (ROUTINE X 2)  LACTIC   ACID, PLASMA    EKG None  Radiology No results found.  Procedures Procedures   Medications Ordered in ED Medications  oxyCODONE-acetaminophen (PERCOCET/ROXICET) 5-325 MG per tablet 1 tablet (1 tablet Oral Given 08/19/21 2016)    ED Course  I have reviewed the triage vital signs and the nursing notes.  Pertinent labs & imaging results that were available during my care of the patient were reviewed by me and considered in my medical decision making (see chart for details).    MDM Rules/Calculators/A&P                           45-year-old male presents emergency department ongoing lower back pain, surgery on 10/31 with Dr. Jones.  Was recently admitted for pain control.  At that admission patient had an elevated white count that down trended, ESR was negative.  MRI showed most likely postsurgical changes versus infection.  Patient was discharged home for outpatient follow-up.  Today complaining of ongoing/worsening lower back pain.  No associated neuro symptoms, he is neuro intact in bed, no and saddle anesthesia.  Vascularly intact in the bilateral lower extremities.  Able to maneuver himself in the bed without difficulty.  Incision site looks appropriate.  Afebrile here with normal vitals.  Spoke with on-call PA Kimberly, they are familiar with the patient.  After the patient was discharged they sent a dose of Dilaudid to the  pharmacy however the patient has not picked this up for pain control.  They have reviewed his work-up today, repeat ESR and CRP are unchanged.  He is afebrile, low suspicion for acute infection.  They do not feel there is any emergent indication for repeat MRI imaging.  Patient is otherwise neuro intact.  They recommend dose of 10 mg of Decadron and discharge for outpatient follow-up.  I believe that this is appropriate and agree with management.  Patient at this time appears safe and stable for discharge and will be treated as an outpatient.  Discharge plan and strict return to ED precautions discussed, patient verbalizes understanding and agreement.  Final Clinical Impression(s) / ED Diagnoses Final diagnoses:  None    Rx / DC Orders ED Discharge Orders     None        Horton, Kristie M, DO 08/20/21 1516  

## 2021-08-24 LAB — CULTURE, BLOOD (ROUTINE X 2)
Culture: NO GROWTH
Culture: NO GROWTH

## 2021-09-01 ENCOUNTER — Other Ambulatory Visit: Payer: Self-pay | Admitting: Neurological Surgery

## 2021-09-01 ENCOUNTER — Other Ambulatory Visit (HOSPITAL_COMMUNITY): Payer: Self-pay | Admitting: Neurological Surgery

## 2021-09-01 DIAGNOSIS — M542 Cervicalgia: Secondary | ICD-10-CM

## 2021-09-09 ENCOUNTER — Ambulatory Visit (HOSPITAL_BASED_OUTPATIENT_CLINIC_OR_DEPARTMENT_OTHER)
Admission: RE | Admit: 2021-09-09 | Discharge: 2021-09-09 | Disposition: A | Discharge status: Home / Self Care | Payer: No Typology Code available for payment source | Source: Ambulatory Visit | Attending: Neurological Surgery | Admitting: Neurological Surgery

## 2021-09-09 ENCOUNTER — Other Ambulatory Visit: Payer: Self-pay

## 2021-09-09 ENCOUNTER — Encounter (HOSPITAL_BASED_OUTPATIENT_CLINIC_OR_DEPARTMENT_OTHER): Payer: Self-pay

## 2021-09-09 DIAGNOSIS — M542 Cervicalgia: Secondary | ICD-10-CM | POA: Insufficient documentation

## 2021-09-22 ENCOUNTER — Other Ambulatory Visit (HOSPITAL_COMMUNITY): Payer: Self-pay | Admitting: Orthopedic Surgery

## 2021-09-22 ENCOUNTER — Other Ambulatory Visit: Payer: Self-pay | Admitting: Orthopedic Surgery

## 2021-09-22 DIAGNOSIS — M25511 Pain in right shoulder: Secondary | ICD-10-CM

## 2021-09-30 ENCOUNTER — Ambulatory Visit (HOSPITAL_COMMUNITY)
Admission: RE | Admit: 2021-09-30 | Discharge: 2021-09-30 | Disposition: A | Discharge status: Home / Self Care | Payer: No Typology Code available for payment source | Source: Ambulatory Visit | Attending: Orthopedic Surgery | Admitting: Orthopedic Surgery

## 2021-09-30 ENCOUNTER — Other Ambulatory Visit: Payer: Self-pay

## 2021-09-30 DIAGNOSIS — M25511 Pain in right shoulder: Secondary | ICD-10-CM | POA: Diagnosis not present

## 2021-09-30 MED ORDER — METHYLPREDNISOLONE ACETATE 40 MG/ML IJ SUSP
INTRAMUSCULAR | Status: AC
  Filled 2021-09-30: qty 1

## 2021-09-30 MED ORDER — METHYLPREDNISOLONE ACETATE 40 MG/ML INJ SUSP (RADIOLOG
40.0000 mg | Freq: Once | INTRAMUSCULAR | Status: AC
  Administered 2021-09-30: 40 mg via INTRA_ARTICULAR

## 2021-09-30 MED ORDER — BUPIVACAINE HCL (PF) 0.25 % IJ SOLN
INTRAMUSCULAR | Status: AC
  Filled 2021-09-30: qty 30

## 2021-09-30 MED ORDER — LIDOCAINE HCL (PF) 1 % IJ SOLN
5.0000 mL | Freq: Once | INTRAMUSCULAR | Status: AC
  Administered 2021-09-30: 5 mL via INTRADERMAL

## 2021-09-30 MED ORDER — BUPIVACAINE HCL (PF) 0.25 % IJ SOLN
5.0000 mL | Freq: Once | INTRAMUSCULAR | Status: AC
  Administered 2021-09-30: 5 mL via INTRA_ARTICULAR

## 2021-09-30 MED ORDER — IOHEXOL 180 MG/ML  SOLN
10.0000 mL | Freq: Once | INTRAMUSCULAR | Status: AC | PRN
  Administered 2021-09-30: 10 mL via INTRA_ARTICULAR

## 2021-11-13 ENCOUNTER — Other Ambulatory Visit: Payer: Self-pay | Admitting: Neurological Surgery

## 2021-11-13 DIAGNOSIS — M5416 Radiculopathy, lumbar region: Secondary | ICD-10-CM

## 2021-12-08 ENCOUNTER — Other Ambulatory Visit: Payer: Self-pay

## 2021-12-08 ENCOUNTER — Ambulatory Visit
Admission: RE | Admit: 2021-12-08 | Discharge: 2021-12-08 | Disposition: A | Discharge status: Home / Self Care | Payer: No Typology Code available for payment source | Source: Ambulatory Visit | Attending: Neurological Surgery | Admitting: Neurological Surgery

## 2021-12-08 DIAGNOSIS — M5416 Radiculopathy, lumbar region: Secondary | ICD-10-CM

## 2022-02-10 IMAGING — CT CT CERVICAL SPINE W/O CM
3 of 4 series · 10 of 33 positions shown, 12 images · non-contrast
Comparison: 12/02/2017 cervical MRI

CLINICAL DATA: Follow-up neck pain in the posterior midline.
History of cervical fusion in 2645.

EXAM:
CT CERVICAL SPINE WITHOUT CONTRAST
TECHNIQUE: Multidetector CT imaging of the cervical spine was performed without
intravenous contrast. Multiplanar CT image reconstructions were also
generated.

[Series 5: cor bone · coronal · 0.31mm/px · 3 of 65 slices shown]
[im 17/65  bone]
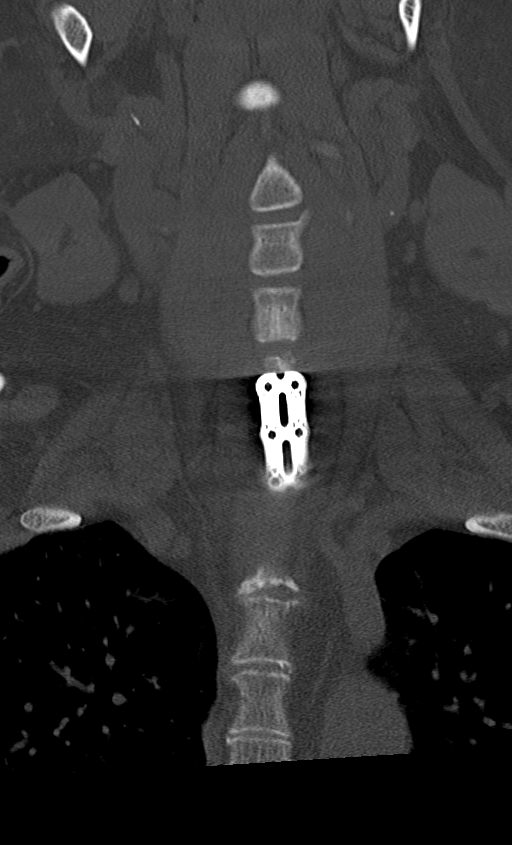
[im 27/65  bone]
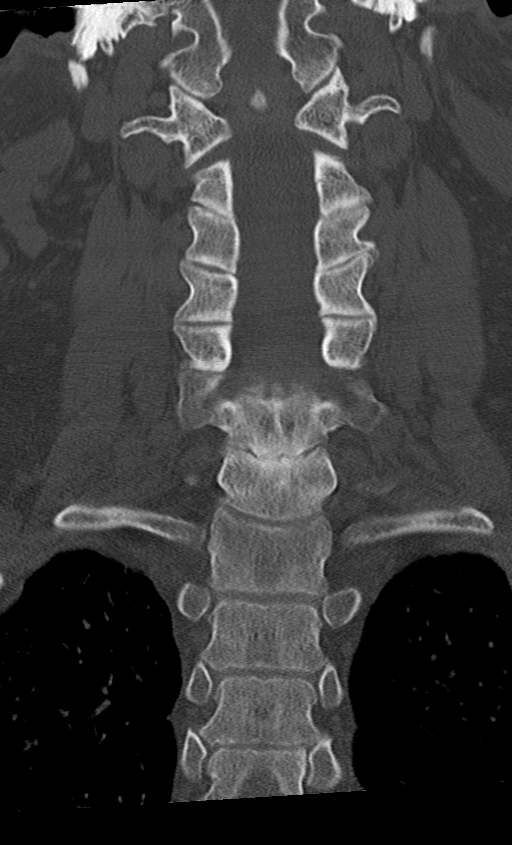
[im 38/65  bone]
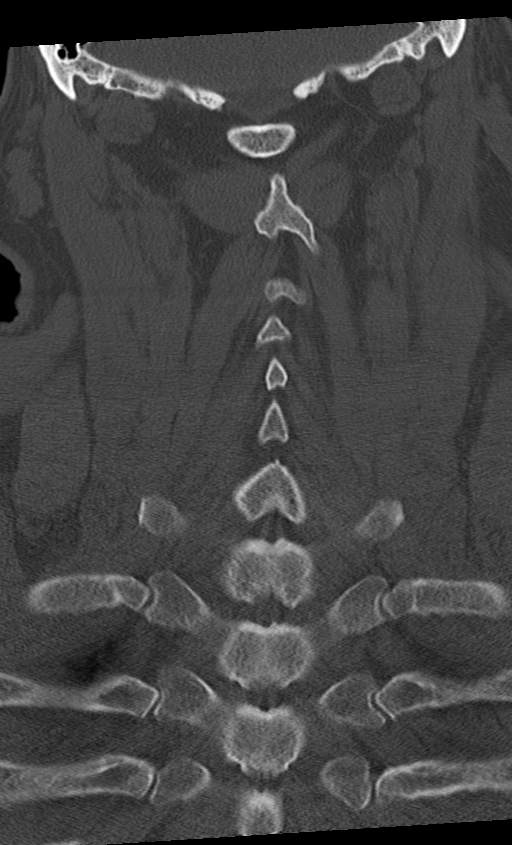

[Series 6: orthogonal axials · axial · 0.21mm/px · z∈[-274,-218]mm · 2 of 117 slices shown, 3 images]
[im 39/117  soft-tissue]
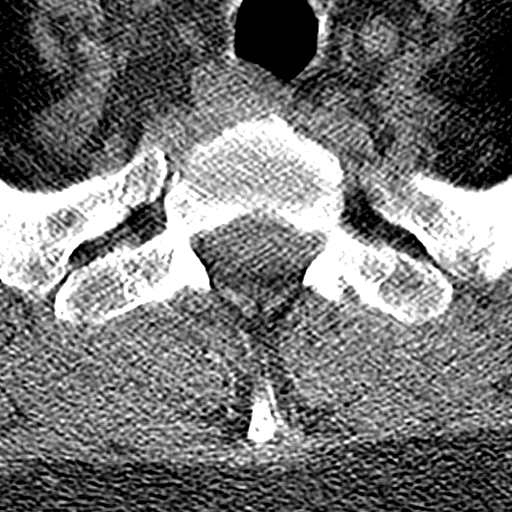
[im 39/117  bone]
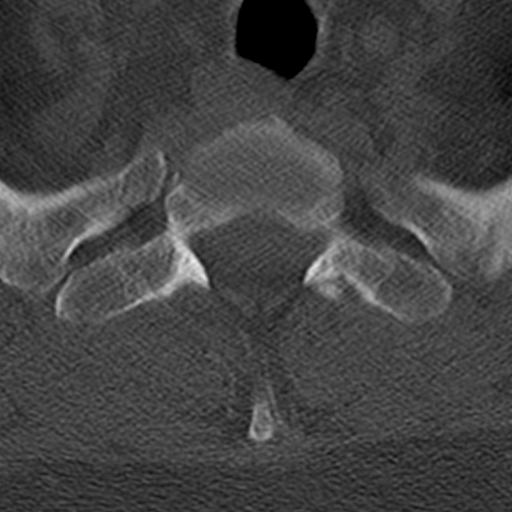
[im 78/117  bone]
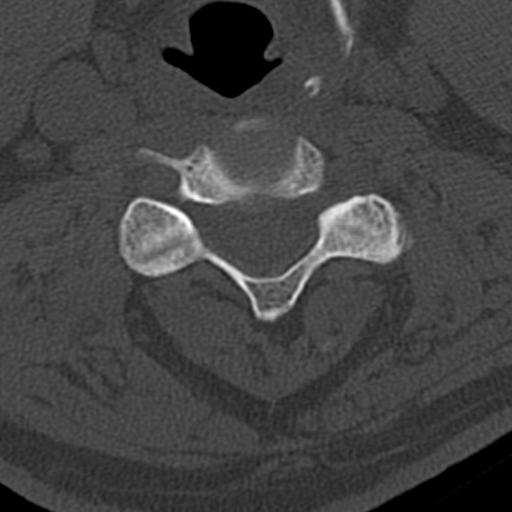

[Series 7: sag bone · sagittal · 0.25mm/px · 5 of 81 slices shown, 6 images]
[im 27/81  bone]
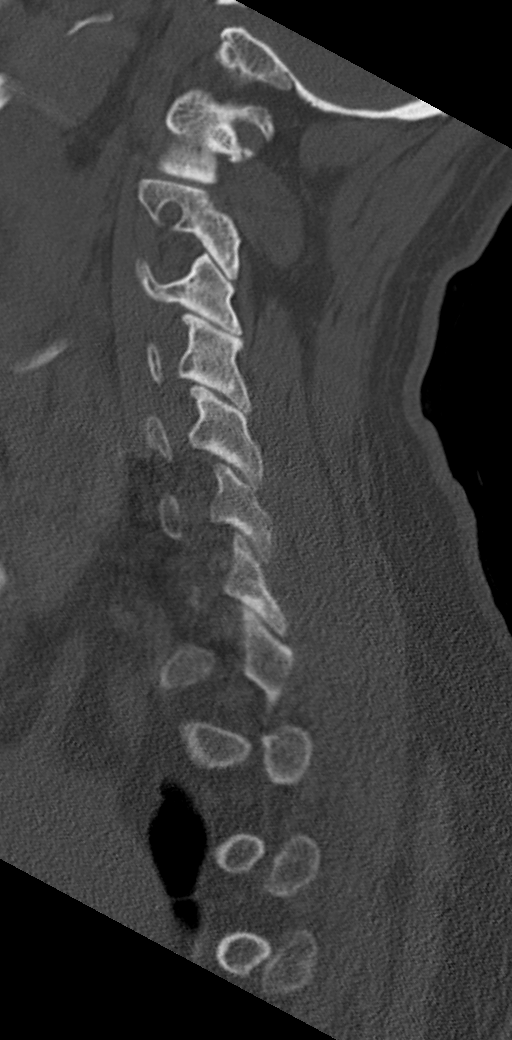
[im 34/81  bone]
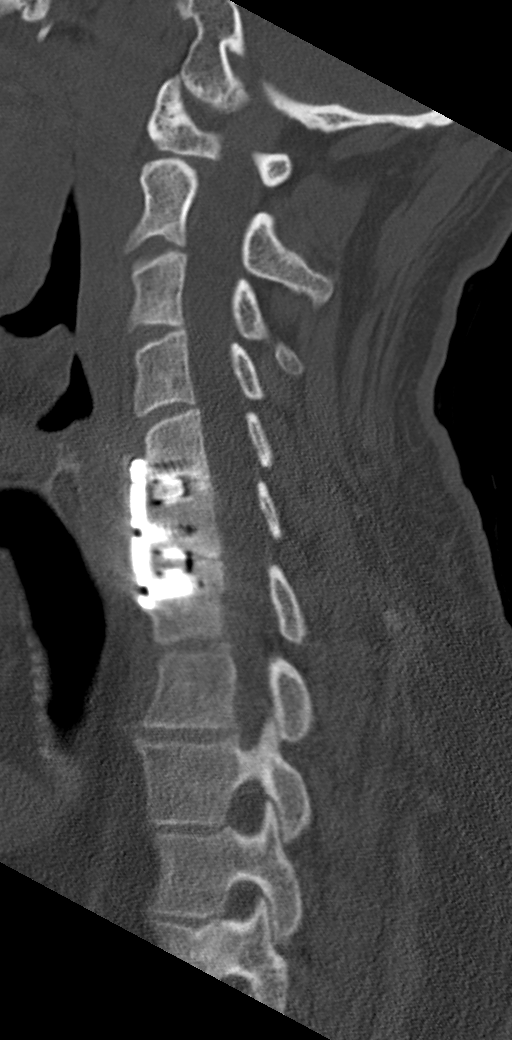
[im 41/81  soft-tissue]
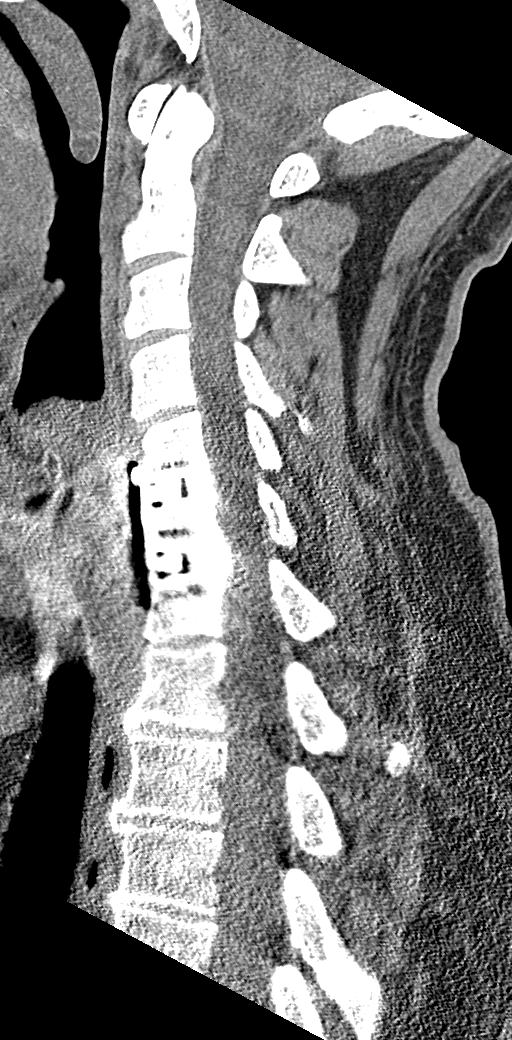
[im 41/81  bone]
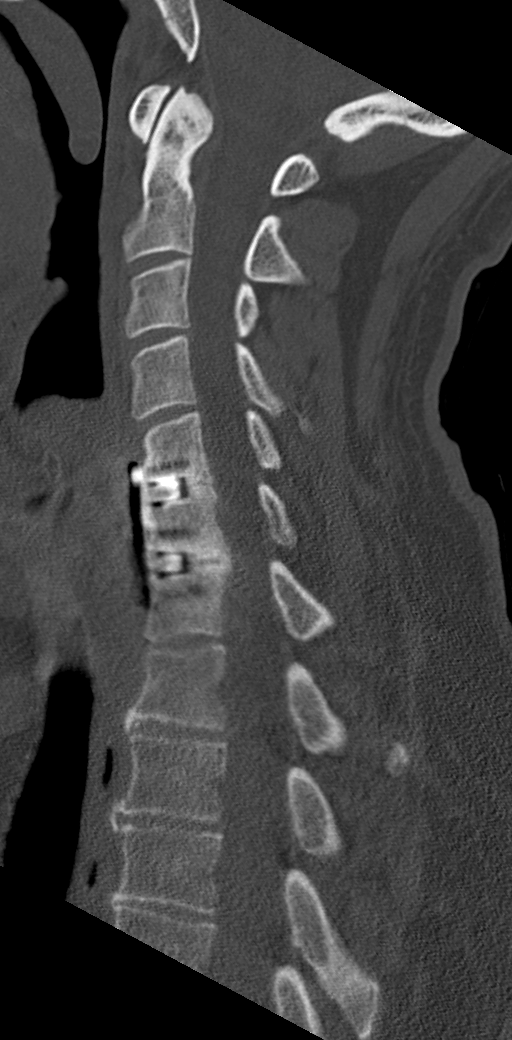
[im 47/81  bone]
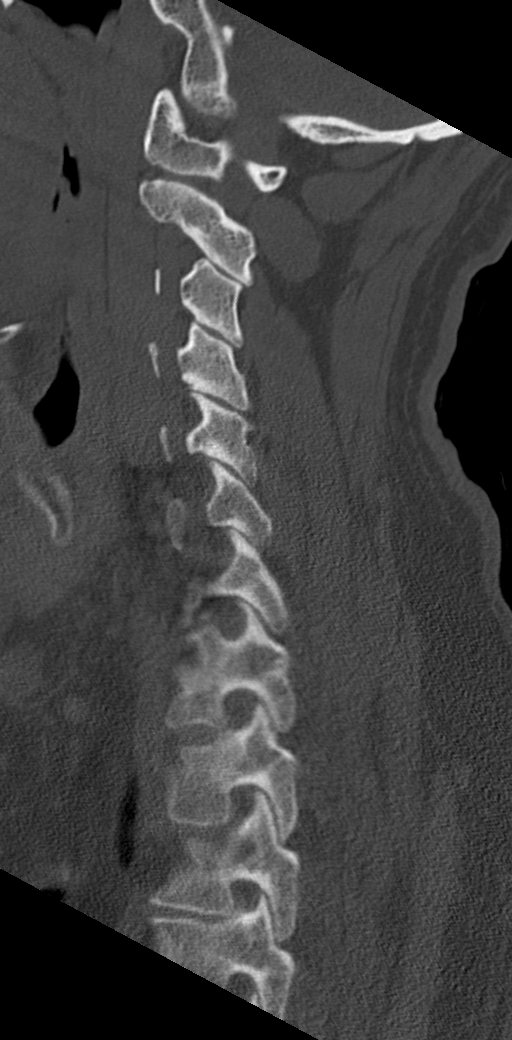
[im 54/81  bone]
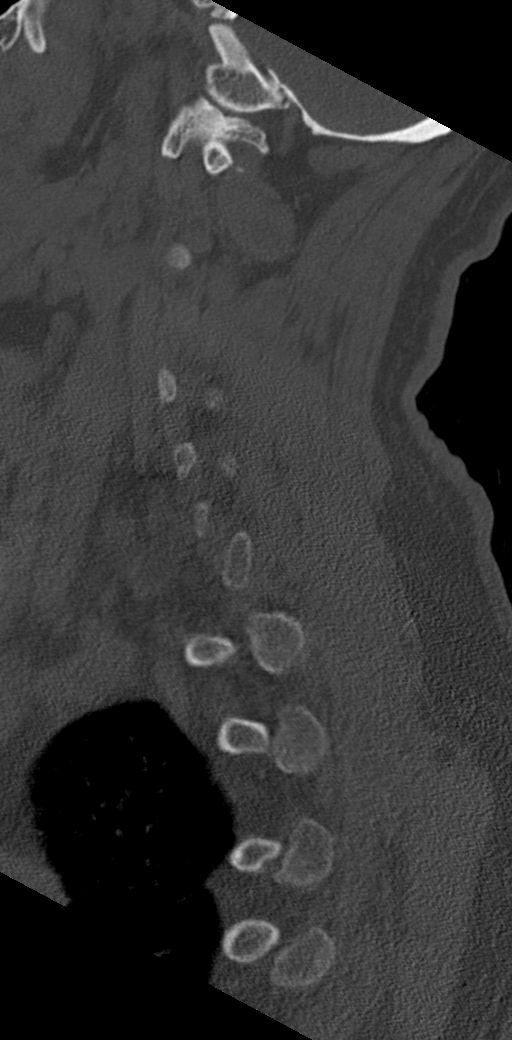

[10 of 33 positions shown; findings below may reference images not displayed]

FINDINGS: Alignment: Negative for listhesis.

Skull base and vertebrae: ACDF at C5-6 and C6-7 with solid
arthrodesis. There has been right laminotomy at C6-7. Negative for
fracture or bone lesion.

Soft tissues and spinal canal: Negative for perispinal mass or
inflammation

Disc levels: No significant adjacent segment degeneration or visible
neural compression. No remaining stenosis at C5-6 and C6-7

Upper chest: Faint airspace type opacity at the apices, often
smoking-related
IMPRESSION: 1. C5-6 and C6-7 ACDF with solid arthrodesis.
2. No significant adjacent segment degeneration or impingement.

## 2022-06-04 ENCOUNTER — Other Ambulatory Visit: Payer: Self-pay | Admitting: Neurological Surgery

## 2022-06-04 DIAGNOSIS — G894 Chronic pain syndrome: Secondary | ICD-10-CM

## 2022-06-16 ENCOUNTER — Other Ambulatory Visit: Payer: No Typology Code available for payment source

## 2022-06-29 ENCOUNTER — Ambulatory Visit
Admission: RE | Admit: 2022-06-29 | Discharge: 2022-06-29 | Disposition: A | Discharge status: Home / Self Care | Payer: No Typology Code available for payment source | Source: Ambulatory Visit | Attending: Neurological Surgery | Admitting: Neurological Surgery

## 2022-06-29 DIAGNOSIS — G894 Chronic pain syndrome: Secondary | ICD-10-CM

## 2022-10-25 ENCOUNTER — Other Ambulatory Visit (HOSPITAL_COMMUNITY): Payer: Self-pay | Admitting: Orthopedic Surgery

## 2022-10-25 DIAGNOSIS — M25562 Pain in left knee: Secondary | ICD-10-CM

## 2022-10-25 DIAGNOSIS — M25561 Pain in right knee: Secondary | ICD-10-CM

## 2022-11-08 ENCOUNTER — Ambulatory Visit (HOSPITAL_COMMUNITY)
Admission: RE | Admit: 2022-11-08 | Discharge: 2022-11-08 | Disposition: A | Discharge status: Home / Self Care | Payer: No Typology Code available for payment source | Source: Ambulatory Visit | Attending: Orthopedic Surgery | Admitting: Orthopedic Surgery

## 2022-11-08 DIAGNOSIS — M25561 Pain in right knee: Secondary | ICD-10-CM

## 2022-11-08 DIAGNOSIS — M25562 Pain in left knee: Secondary | ICD-10-CM | POA: Diagnosis present

## 2023-02-05 ENCOUNTER — Ambulatory Visit: Payer: Self-pay | Admitting: Student

## 2023-02-10 NOTE — Progress Notes (Addendum)
  PCP -Dr. Letta Median  clearance on chart 01-11-23  Cardiologist - no  PPM/ICD -  Device Orders -  Rep Notified -   Chest x-ray -  EKG -  Stress Test -  ECHO -  Cardiac Cath -   Sleep Study -  CPAP -   Fasting Blood Sugar -  Checks Blood Sugar _____ times a day  Blood Thinner Instructions: Aspirin Instructions:81 mg stop 1 week  ERAS Protcol - PRE-SURGERY Ensure     COVID vaccine -x 1  Activity--Able to climb a flight of stairs without SOB or CP Anesthesia review: HTN, OSA bipap, cirrhosis  Patient denies shortness of breath, fever, cough and chest pain at PAT appointment   All instructions explained to the patient, with a verbal understanding of the material. Patient agrees to go over the instructions while at home for a better understanding. Patient also instructed to self quarantine after being tested for COVID-19. The opportunity to ask questions was provided.

## 2023-02-10 NOTE — Patient Instructions (Addendum)
SURGICAL WAITING ROOM VISITATION  Patients having surgery or a procedure may have no more than 2 support people in the waiting area - these visitors may rotate.    Children under the age of 15 must have an adult with them who is not the patient.  If the patient needs to stay at the hospital during part of their recovery, the visitor guidelines for inpatient rooms apply.  Pre-op nurse will coordinate an appropriate time for 1 support person to accompany patient in pre-op.  This support person may not rotate.    Please refer to the [facility] website for the visitor guidelines for Inpatients (after your surgery is over and you are in a regular room).       Your procedure is scheduled on: 03-02-23   Report to [facility] Main Entrance    Report to admitting at       11:15 AM   Call this number if you have problems the morning of surgery [phone]   Do not eat food :After Midnight.   After Midnight you may have the following liquids until _1045 _____ AM DAY OF SURGERY   then nothing by mouth  Water Non-Citrus Juices (without pulp, NO RED-Apple, White grape, White cranberry) Black Coffee (NO MILK/CREAM OR CREAMERS, sugar ok)  Clear Tea (NO MILK/CREAM OR CREAMERS, sugar ok) regular and decaf                             Plain Jell-O (NO RED)                                           Fruit ices (not with fruit pulp, NO RED)                                     Popsicles (NO RED)                                                               Sports drinks like Gatorade (NO RED)              .        The day of surgery:  Drink ONE (1) Pre-Surgery Clear Ensure  at     1030 AM the morning of surgery. Drink in one sitting. Do not sip.  This drink was given to you during your hospital  pre-op appointment visit. Nothing else to drink after completing the  Pre-Surgery Clear Ensureby 1045 am.          If you have questions, please contact your surgeon's office.   FOLLOW  ANY  ADDITIONAL PRE OP INSTRUCTIONS YOU RECEIVED FROM YOUR SURGEON'S OFFICE!!!     Oral Hygiene is also important to reduce your risk of infection.                                    Remember - BRUSH YOUR TEETH THE MORNING OF SURGERY WITH YOUR REGULAR TOOTHPASTE  DENTURES  WILL BE REMOVED PRIOR TO SURGERY PLEASE DO NOT APPLY "Poly grip" OR ADHESIVES!!!   Do NOT smoke after Midnight   Take these medicines the morning of surgery with A SIP OF WATER: Bupropion, fluoxetine, hydrocodone if needed    Bring CPAP mask and tubing day of surgery.                              You may not have any metal on your body including hair pins, jewelry, and body piercing             Do not wear  lotions, powders, perfumes/cologne, or deodorant               Men may shave face and neck.   Do not bring valuables to the hospital. [facility] IS NOT             RESPONSIBLE   FOR VALUABLES.   Contacts, glasses, dentures or bridgework may not be worn into surgery.   Bring small overnight bag day of surgery.   DO NOT BRING YOUR HOME MEDICATIONS TO THE HOSPITAL. PHARMACY WILL DISPENSE MEDICATIONS LISTED ON YOUR MEDICATION LIST TO YOU DURING YOUR ADMISSION IN THE HOSPITAL!      Special Instructions: If you have one Bring a copy of your healthcare power of attorney and living will documents the day of surgery if you haven't scanned them before.              Please read over the following fact sheets you were given: IF YOU HAVE QUESTIONS ABOUT YOUR PRE-OP INSTRUCTIONS PLEASE CALL [phone]    If you test positive for Covid or have been in contact with anyone that has tested positive in the last 10 days please notify you surgeon.      Pre-operative 5 CHG Bath Instructions   You can play a key role in reducing the risk of infection after surgery. Your skin needs to be as free of germs as possible. You can reduce the number of germs on your skin by washing with CHG (chlorhexidine gluconate) soap before  surgery. CHG is an antiseptic soap that kills germs and continues to kill germs even after washing.   DO NOT use if you have an allergy to chlorhexidine/CHG or antibacterial soaps. If your skin becomes reddened or irritated, stop using the CHG and notify one of our RNs at [phone].   Please shower with the CHG soap starting 4 days before surgery using the following schedule:     Please keep in mind the following:  DO NOT shave, including legs and underarms, starting the day of your first shower.   You may shave your face at any point before/day of surgery.  Place clean sheets on your bed the day you start using CHG soap. Use a clean washcloth (not used since being washed) for each shower. DO NOT sleep with pets once you start using the CHG.   CHG Shower Instructions:  If you choose to wash your hair and private area, wash first with your normal shampoo/soap.  After you use shampoo/soap, rinse your hair and body thoroughly to remove shampoo/soap residue.  Turn the water OFF and apply about 3 tablespoons (45 ml) of CHG soap to a CLEAN washcloth.  Apply CHG soap ONLY FROM YOUR NECK DOWN TO YOUR TOES (washing for 3-5 minutes)  DO NOT use CHG soap on face, private areas, open wounds, or sores.  Pay special attention to the area where your surgery is being performed.  If you are having back surgery, having someone wash your back for you may be helpful. Wait 2 minutes after CHG soap is applied, then you may rinse off the CHG soap.  Pat dry with a clean towel  Put on clean clothes/pajamas   If you choose to wear lotion, please use ONLY the CHG-compatible lotions on the back of this paper.     Additional instructions for the day of surgery: DO NOT APPLY any lotions, deodorants, cologne, or perfumes.   Put on clean/comfortable clothes.  Brush your teeth.  Ask your nurse before applying any prescription medications to the skin.      CHG Compatible Lotions   Aveeno Moisturizing  lotion  Cetaphil Moisturizing Cream  Cetaphil Moisturizing Lotion  Clairol Herbal Essence Moisturizing Lotion, Dry Skin  Clairol Herbal Essence Moisturizing Lotion, Extra Dry Skin  Clairol Herbal Essence Moisturizing Lotion, Normal Skin  Curel Age Defying Therapeutic Moisturizing Lotion with Alpha Hydroxy  Curel Extreme Care Body Lotion  Curel Soothing Hands Moisturizing Hand Lotion  Curel Therapeutic Moisturizing Cream, Fragrance-Free  Curel Therapeutic Moisturizing Lotion, Fragrance-Free  Curel Therapeutic Moisturizing Lotion, Original Formula  Eucerin Daily Replenishing Lotion  Eucerin Dry Skin Therapy Plus Alpha Hydroxy Crme  Eucerin Dry Skin Therapy Plus Alpha Hydroxy Lotion  Eucerin Original Crme  Eucerin Original Lotion  Eucerin Plus Crme Eucerin Plus Lotion  Eucerin TriLipid Replenishing Lotion  Keri Anti-Bacterial Hand Lotion  Keri Deep Conditioning Original Lotion Dry Skin Formula Softly Scented  Keri Deep Conditioning Original Lotion, Fragrance Free Sensitive Skin Formula  Keri Lotion Fast Absorbing Fragrance Free Sensitive Skin Formula  Keri Lotion Fast Absorbing Softly Scented Dry Skin Formula  Keri Original Lotion  Keri Skin Renewal Lotion Keri Silky Smooth Lotion  Keri Silky Smooth Sensitive Skin Lotion  Nivea Body Creamy Conditioning Oil  Nivea Body Extra Enriched Lotion  Nivea Body Original Lotion  Nivea Body Sheer Moisturizing Lotion Nivea Crme  Nivea Skin Firming Lotion  NutraDerm 30 Skin Lotion  NutraDerm Skin Lotion  NutraDerm Therapeutic Skin Cream  NutraDerm Therapeutic Skin Lotion  ProShield Protective Hand Cream  Provon moisturizing lotion     Incentive Spirometer  An incentive spirometer is a tool that can help keep your lungs clear and active. This tool measures how well you are filling your lungs with each breath. Taking long deep breaths may help reverse or decrease the chance of developing breathing (pulmonary) problems (especially  infection) following: A long period of time when you are unable to move or be active. BEFORE THE PROCEDURE  If the spirometer includes an indicator to show your best effort, your nurse or respiratory therapist will set it to a desired goal. If possible, sit up straight or lean slightly forward. Try not to slouch. Hold the incentive spirometer in an upright position. INSTRUCTIONS FOR USE  Sit on the edge of your bed if possible, or sit up as far as you can in bed or on a chair. Hold the incentive spirometer in an upright position. Breathe out normally. Place the mouthpiece in your mouth and seal your lips tightly around it. Breathe in slowly and as deeply as possible, raising the piston or the ball toward the top of the column. Hold your breath for 3-5 seconds or for as long as possible. Allow the piston or ball to fall to the bottom of the column. Remove the mouthpiece from your mouth and breathe out normally. Rest  for a few seconds and repeat Steps 1 through 7 at least 10 times every 1-2 hours when you are awake. Take your time and take a few normal breaths between deep breaths. The spirometer may include an indicator to show your best effort. Use the indicator as a goal to work toward during each repetition. After each set of 10 deep breaths, practice coughing to be sure your lungs are clear. If you have an incision (the cut made at the time of surgery), support your incision when coughing by placing a pillow or rolled up towels firmly against it. Once you are able to get out of bed, walk around indoors and cough well. You may stop using the incentive spirometer when instructed by your caregiver.  RISKS AND COMPLICATIONS Take your time so you do not get dizzy or light-headed. If you are in pain, you may need to take or ask for pain medication before doing incentive spirometry. It is harder to take a deep breath if you are having pain. AFTER USE Rest and breathe slowly and easily. It can be  helpful to keep track of a log of your progress. Your caregiver can provide you with a simple table to help with this. If you are using the spirometer at home, follow these instructions: SEEK MEDICAL CARE IF:  You are having difficultly using the spirometer. You have trouble using the spirometer as often as instructed. Your pain medication is not giving enough relief while using the spirometer. You develop fever of 100.5 F (38.1 C) or higher. SEEK IMMEDIATE MEDICAL CARE IF:  You cough up bloody sputum that had not been present before. You develop fever of 102 F (38.9 C) or greater. You develop worsening pain at or near the incision site. MAKE SURE YOU:  Understand these instructions. Will watch your condition. Will get help right away if you are not doing well or get worse. Document Released: 01/17/2007 Document Revised: 11/29/2011 Document Reviewed: 03/20/2007 [facility] Patient Information 2014 [place], [place].   ________________________________________________________________________

## 2023-02-17 ENCOUNTER — Encounter (HOSPITAL_COMMUNITY): Payer: Self-pay

## 2023-02-17 ENCOUNTER — Other Ambulatory Visit: Payer: Self-pay

## 2023-02-17 ENCOUNTER — Encounter (HOSPITAL_COMMUNITY)
Admission: RE | Admit: 2023-02-17 | Discharge: 2023-02-17 | Disposition: A | Discharge status: Home / Self Care | Payer: No Typology Code available for payment source | Source: Ambulatory Visit | Attending: Orthopedic Surgery | Admitting: Orthopedic Surgery

## 2023-02-17 VITALS — BP 145/93 | HR 71 | Temp 98.2°F | Resp 16 | Ht 72.0 in | Wt 256.0 lb

## 2023-02-17 DIAGNOSIS — I10 Essential (primary) hypertension: Secondary | ICD-10-CM

## 2023-02-17 DIAGNOSIS — Z01818 Encounter for other preprocedural examination: Secondary | ICD-10-CM

## 2023-02-17 LAB — CBC
HCT: 46.4 % (ref 39.0–52.0)
Hemoglobin: 15.3 g/dL (ref 13.0–17.0)
MCH: 29.5 pg (ref 26.0–34.0)
MCHC: 33 g/dL (ref 30.0–36.0)
MCV: 89.4 fL (ref 80.0–100.0)
Platelets: 385 10*3/uL (ref 150–400)
RBC: 5.19 MIL/uL (ref 4.22–5.81)
RDW: 14.1 % (ref 11.5–15.5)
WBC: 5.7 10*3/uL (ref 4.0–10.5)
nRBC: 0 % (ref 0.0–0.2)

## 2023-02-17 LAB — COMPREHENSIVE METABOLIC PANEL
ALT: 50 U/L — ABNORMAL HIGH (ref 0–44)
AST: 33 U/L (ref 15–41)
Albumin: 4.4 g/dL (ref 3.5–5.0)
Alkaline Phosphatase: 63 U/L (ref 38–126)
Anion gap: 8 (ref 5–15)
BUN: 14 mg/dL (ref 6–20)
CO2: 23 mmol/L (ref 22–32)
Calcium: 9.3 mg/dL (ref 8.9–10.3)
Chloride: 108 mmol/L (ref 98–111)
Creatinine, Ser: 0.99 mg/dL (ref 0.61–1.24)
GFR, Estimated: >60 mL/min (ref >60)
Glucose, Bld: 101 mg/dL — ABNORMAL HIGH (ref 70–99)
Potassium: 4.6 mmol/L (ref 3.5–5.1)
Sodium: 139 mmol/L (ref 135–145)
Total Bilirubin: 0.6 mg/dL (ref 0.3–1.2)
Total Protein: 7.6 g/dL (ref 6.5–8.1)

## 2023-02-17 LAB — SURGICAL PCR SCREEN
MRSA, PCR: NEGATIVE
Staphylococcus aureus: POSITIVE — AB

## 2023-02-28 ENCOUNTER — Ambulatory Visit: Payer: Self-pay | Admitting: Student

## 2023-02-28 NOTE — H&P (View-Only) (Signed)
TOTAL KNEE ADMISSION H&P  Patient is being admitted for right total knee arthroplasty.  Subjective:  Chief Complaint:right knee pain.  HPI: Juan Long, 47 y.o. male, has a history of pain and functional disability in the right knee due to arthritis and has failed non-surgical conservative treatments for greater than 12 weeks to includeNSAID's and/or analgesics, corticosteriod injections, viscosupplementation injections, flexibility and strengthening excercises, use of assistive devices, and activity modification.  Onset of symptoms was gradual, starting 10 years ago with rapidlly worsening course since that time. The patient noted prior procedures on the knee to include  arthroscopy on the right knee(s).  Patient currently rates pain in the right knee(s) at 10 out of 10 with activity. Patient has night pain, worsening of pain with activity and weight bearing, pain that interferes with activities of daily living, pain with passive range of motion, crepitus, and joint swelling.  Patient has evidence of subchondral cysts, subchondral sclerosis, periarticular osteophytes, and joint space narrowing by imaging studies. There is no active infection.  Patient Active Problem List   Diagnosis Date Noted   Back pain 08/17/2021   Postoperative pain after spinal surgery 08/16/2021   Sacroiliitis (HCC) 11/04/2020   S/P lumbar fusion 12/05/2019   Radiculopathy 01/04/2018   Past Medical History:  Diagnosis Date   Arthritis    Depression    Essential hypertension, benign    many years ago  no meds   Obstructive sleep apnea (adult) (pediatric)    Bipap   OSA on CPAP    Spondylisthesis     Past Surgical History:  Procedure Laterality Date   ANTERIOR CERVICAL DECOMP/DISCECTOMY FUSION Right 01/04/2018   Procedure: ANTERIOR CERVICAL DECOMPRESSION FUSION CERVICAL FIVE-SIX, CERVICAL SIX-SEVEN WITH INSTRUMENTATION AND ALLOGRAFT;  Surgeon: Estill Bamberg, MD;  Location: [facility];  Service: Orthopedics;   Laterality: Right;   BACK SURGERY     CERVICAL SPINE SURGERY     KNEE ARTHROSCOPY Bilateral    2023   LUMBAR LAMINECTOMY/DECOMPRESSION MICRODISCECTOMY Right 11/04/2020   Procedure: SI Joint Fusion;  Surgeon: Bethann Goo, DO;  Location: [facility];  Service: Neurosurgery;  Laterality: Right;   MULTIPLE TOOTH EXTRACTIONS     NASAL SEPTUM SURGERY     peripheral nerve stimulator     SACROILIAC JOINT FUSION Left 02/27/2021   Procedure: Left sacroiliac joint fusion;  Surgeon: Dawley, Alan Mulder, DO;  Location: [facility];  Service: Neurosurgery;  Laterality: Left;   SHOULDER ARTHROSCOPY Right 2019   TONSILLECTOMY     uvula shaved     VARICOCELE EXCISION     WISDOM TOOTH EXTRACTION      Current Outpatient Medications  Medication Sig Dispense Refill Last Dose   aspirin EC 81 MG tablet Take 1 tablet (81 mg total) by mouth daily. 30 tablet 11    buPROPion (WELLBUTRIN XL) 300 MG 24 hr tablet Take 300 mg by mouth in the morning.      Carboxymethylcellulose Sodium (LUBRICANT EYE DROPS OP) Place 1 drop into both eyes 3 (three) times daily as needed (dry eyes).      FLUoxetine (PROZAC) 20 MG tablet Take 60 mg by mouth daily.      HYDROcodone-acetaminophen (NORCO) 10-325 MG tablet Take 1 tablet by mouth every 6 (six) hours as needed (Back pain).      naproxen (NAPROSYN) 500 MG tablet Take 1,000 mg by mouth daily as needed for moderate pain or mild pain.      vitamin E 180 MG (400 UNITS) capsule Take 400 Units  by mouth daily.      No current facility-administered medications for this visit.   Allergies  Allergen Reactions   Lamotrigine Rash    Eruption, Chill    Social History   Tobacco Use   Smoking status: Former    Years: 2    Types: Cigarettes    Quit date: 2000    Years since quitting: 24.4   Smokeless tobacco: Former    Types: Snuff   Tobacco comments:    quit 2000  Substance Use Topics   Alcohol use: Yes    Comment: occasional    Family History  Problem Relation Age of Onset   Heart  attack Father      Review of Systems  Musculoskeletal:  Positive for arthralgias, gait problem and joint swelling.  All other systems reviewed and are negative.   Objective:  Physical Exam Constitutional:      Appearance: Normal appearance.  HENT:     Head: Normocephalic and atraumatic.     Nose: Nose normal.     Mouth/Throat:     Mouth: Mucous membranes are moist.     Pharynx: Oropharynx is clear.  Eyes:     Conjunctiva/sclera: Conjunctivae normal.  Cardiovascular:     Rate and Rhythm: Normal rate and regular rhythm.     Pulses: Normal pulses.     Heart sounds: Normal heart sounds.  Pulmonary:     Effort: Pulmonary effort is normal.     Breath sounds: Normal breath sounds.  Abdominal:     General: Abdomen is flat.     Palpations: Abdomen is soft.  Genitourinary:    Comments: Deferred.  Musculoskeletal:     Cervical back: Normal range of motion and neck supple.     Comments: Examination of the right knee reveals no skin wounds or lesions. He has healed arthroscopy portals. He has swelling, trace effusion. No warmth or erythema. He has tenderness to palpation medial joint line, lateral joint line, peripatellar retinacular tissues with a positive grind sign. His range of motion is 5 to 125 degrees without any ligamentous instability. No extensor lag. Painless range of motion of the hips.  Distally, there is no focal motor or sensory deficit. Has palpable pedal pulses.  Skin:    General: Skin is warm and dry.     Capillary Refill: Capillary refill takes less than 2 seconds.  Neurological:     General: No focal deficit present.     Mental Status: He is alert and oriented to person, place, and time.  Psychiatric:        Mood and Affect: Mood normal.        Behavior: Behavior normal.        Thought Content: Thought content normal.        Judgment: Judgment normal.     Vital signs in last 24 hours: @VSRANGES @  Labs:   Estimated body mass index is 34.72 kg/m as  calculated from the following:   Height as of 02/17/23: 6' (1.829 m).   Weight as of 02/17/23: 116.1 kg.   Imaging Review Plain radiographs demonstrate severe degenerative joint disease of the right knee(s). The overall alignment ismild valgus. The bone quality appears to be adequate for age and reported activity level.      Assessment/Plan:  End stage arthritis, right knee   The patient history, physical examination, clinical judgment of the provider and imaging studies are consistent with end stage degenerative joint disease of the right knee(s) and total knee  arthroplasty is deemed medically necessary. The treatment options including medical management, injection therapy arthroscopy and arthroplasty were discussed at length. The risks and benefits of total knee arthroplasty were presented and reviewed. The risks due to aseptic loosening, infection, stiffness, patella tracking problems, thromboembolic complications and other imponderables were discussed. The patient acknowledged the explanation, agreed to proceed with the plan and consent was signed. Patient is being admitted for inpatient treatment for surgery, pain control, PT, OT, prophylactic antibiotics, VTE prophylaxis, progressive ambulation and ADL's and discharge planning. The patient is planning to be discharged home with OPPT after an overnight stay.   Therapy Plans: outpatient therapy. PT at [facility] 03/04/23 1st appointment.  Disposition: Home with wife, Marylene Land Planned DVT Prophylaxis: aspirin 81mg  BID DME needed: walker. Discussed to not to use the rollator after surgery. Iceman prescription and walker prescription provided to patient and sent to [place] in [employer].  PCP: Cleared TXA: IV Allergies:  - lamotrigine - chills and rash.  Anesthesia Concerns: None.  BMI: 35.8 Last HgbA1c: Not diabetic.  Other: - Aspirin 81mg  baseline. - Hx arthroscopy bilateral knees.  - OSA, uses bipap.  - Chronic low back pain, spine surgery x6.   - Oxycodone, zofran, methocarbamol, meloxicam.  - 02/17/23: Hgb 15.3, K+ 4.6, Cr. 0.99.    Patient's anticipated LOS is less than 2 midnights, meeting these requirements: - Younger than 50 - Lives within 1 hour of care - Has a competent adult at home to recover with post-op recover - NO history of  - Chronic pain requiring opiods  - Diabetes  - Coronary Artery Disease  - Heart failure  - Heart attack  - Stroke  - DVT/VTE  - Cardiac arrhythmia  - Respiratory Failure/COPD  - Renal failure  - Anemia  - Advanced Liver disease

## 2023-02-28 NOTE — H&P (Signed)
TOTAL KNEE ADMISSION H&P  Patient is being admitted for right total knee arthroplasty.  Subjective:  Chief Complaint:right knee pain.  HPI: Juan Long, 47 y.o. male, has a history of pain and functional disability in the right knee due to arthritis and has failed non-surgical conservative treatments for greater than 12 weeks to includeNSAID's and/or analgesics, corticosteriod injections, viscosupplementation injections, flexibility and strengthening excercises, use of assistive devices, and activity modification.  Onset of symptoms was gradual, starting 10 years ago with rapidlly worsening course since that time. The patient noted prior procedures on the knee to include  arthroscopy on the right knee(s).  Patient currently rates pain in the right knee(s) at 10 out of 10 with activity. Patient has night pain, worsening of pain with activity and weight bearing, pain that interferes with activities of daily living, pain with passive range of motion, crepitus, and joint swelling.  Patient has evidence of subchondral cysts, subchondral sclerosis, periarticular osteophytes, and joint space narrowing by imaging studies. There is no active infection.  Patient Active Problem List   Diagnosis Date Noted   Back pain 08/17/2021   Postoperative pain after spinal surgery 08/16/2021   Sacroiliitis (HCC) 11/04/2020   S/P lumbar fusion 12/05/2019   Radiculopathy 01/04/2018   Past Medical History:  Diagnosis Date   Arthritis    Depression    Essential hypertension, benign    many years ago  no meds   Obstructive sleep apnea (adult) (pediatric)    Bipap   OSA on CPAP    Spondylisthesis     Past Surgical History:  Procedure Laterality Date   ANTERIOR CERVICAL DECOMP/DISCECTOMY FUSION Right 01/04/2018   Procedure: ANTERIOR CERVICAL DECOMPRESSION FUSION CERVICAL FIVE-SIX, CERVICAL SIX-SEVEN WITH INSTRUMENTATION AND ALLOGRAFT;  Surgeon: Dumonski, Mark, MD;  Location: [facility];  Service: Orthopedics;   Laterality: Right;   BACK SURGERY     CERVICAL SPINE SURGERY     KNEE ARTHROSCOPY Bilateral    2023   LUMBAR LAMINECTOMY/DECOMPRESSION MICRODISCECTOMY Right 11/04/2020   Procedure: SI Joint Fusion;  Surgeon: Dawley, Troy C, DO;  Location: [facility];  Service: Neurosurgery;  Laterality: Right;   MULTIPLE TOOTH EXTRACTIONS     NASAL SEPTUM SURGERY     peripheral nerve stimulator     SACROILIAC JOINT FUSION Left 02/27/2021   Procedure: Left sacroiliac joint fusion;  Surgeon: Dawley, Troy C, DO;  Location: [facility];  Service: Neurosurgery;  Laterality: Left;   SHOULDER ARTHROSCOPY Right 2019   TONSILLECTOMY     uvula shaved     VARICOCELE EXCISION     WISDOM TOOTH EXTRACTION      Current Outpatient Medications  Medication Sig Dispense Refill Last Dose   aspirin EC 81 MG tablet Take 1 tablet (81 mg total) by mouth daily. 30 tablet 11    buPROPion (WELLBUTRIN XL) 300 MG 24 hr tablet Take 300 mg by mouth in the morning.      Carboxymethylcellulose Sodium (LUBRICANT EYE DROPS OP) Place 1 drop into both eyes 3 (three) times daily as needed (dry eyes).      FLUoxetine (PROZAC) 20 MG tablet Take 60 mg by mouth daily.      HYDROcodone-acetaminophen (NORCO) 10-325 MG tablet Take 1 tablet by mouth every 6 (six) hours as needed (Back pain).      naproxen (NAPROSYN) 500 MG tablet Take 1,000 mg by mouth daily as needed for moderate pain or mild pain.      vitamin E 180 MG (400 UNITS) capsule Take 400 Units   by mouth daily.      No current facility-administered medications for this visit.   Allergies  Allergen Reactions   Lamotrigine Rash    Eruption, Chill    Social History   Tobacco Use   Smoking status: Former    Years: 2    Types: Cigarettes    Quit date: 2000    Years since quitting: 24.4   Smokeless tobacco: Former    Types: Snuff   Tobacco comments:    quit 2000  Substance Use Topics   Alcohol use: Yes    Comment: occasional    Family History  Problem Relation Age of Onset   Heart  attack Father      Review of Systems  Musculoskeletal:  Positive for arthralgias, gait problem and joint swelling.  All other systems reviewed and are negative.   Objective:  Physical Exam Constitutional:      Appearance: Normal appearance.  HENT:     Head: Normocephalic and atraumatic.     Nose: Nose normal.     Mouth/Throat:     Mouth: Mucous membranes are moist.     Pharynx: Oropharynx is clear.  Eyes:     Conjunctiva/sclera: Conjunctivae normal.  Cardiovascular:     Rate and Rhythm: Normal rate and regular rhythm.     Pulses: Normal pulses.     Heart sounds: Normal heart sounds.  Pulmonary:     Effort: Pulmonary effort is normal.     Breath sounds: Normal breath sounds.  Abdominal:     General: Abdomen is flat.     Palpations: Abdomen is soft.  Genitourinary:    Comments: Deferred.  Musculoskeletal:     Cervical back: Normal range of motion and neck supple.     Comments: Examination of the right knee reveals no skin wounds or lesions. He has healed arthroscopy portals. He has swelling, trace effusion. No warmth or erythema. He has tenderness to palpation medial joint line, lateral joint line, peripatellar retinacular tissues with a positive grind sign. His range of motion is 5 to 125 degrees without any ligamentous instability. No extensor lag. Painless range of motion of the hips.  Distally, there is no focal motor or sensory deficit. Has palpable pedal pulses.  Skin:    General: Skin is warm and dry.     Capillary Refill: Capillary refill takes less than 2 seconds.  Neurological:     General: No focal deficit present.     Mental Status: He is alert and oriented to person, place, and time.  Psychiatric:        Mood and Affect: Mood normal.        Behavior: Behavior normal.        Thought Content: Thought content normal.        Judgment: Judgment normal.     Vital signs in last 24 hours: @VSRANGES@  Labs:   Estimated body mass index is 34.72 kg/m as  calculated from the following:   Height as of 02/17/23: 6' (1.829 m).   Weight as of 02/17/23: 116.1 kg.   Imaging Review Plain radiographs demonstrate severe degenerative joint disease of the right knee(s). The overall alignment ismild valgus. The bone quality appears to be adequate for age and reported activity level.      Assessment/Plan:  End stage arthritis, right knee   The patient history, physical examination, clinical judgment of the provider and imaging studies are consistent with end stage degenerative joint disease of the right knee(s) and total knee   arthroplasty is deemed medically necessary. The treatment options including medical management, injection therapy arthroscopy and arthroplasty were discussed at length. The risks and benefits of total knee arthroplasty were presented and reviewed. The risks due to aseptic loosening, infection, stiffness, patella tracking problems, thromboembolic complications and other imponderables were discussed. The patient acknowledged the explanation, agreed to proceed with the plan and consent was signed. Patient is being admitted for inpatient treatment for surgery, pain control, PT, OT, prophylactic antibiotics, VTE prophylaxis, progressive ambulation and ADL's and discharge planning. The patient is planning to be discharged home with OPPT after an overnight stay.   Therapy Plans: outpatient therapy. PT at [facility] 03/04/23 1st appointment.  Disposition: Home with wife, Angela Planned DVT Prophylaxis: aspirin 81mg BID DME needed: walker. Discussed to not to use the rollator after surgery. Iceman prescription and walker prescription provided to patient and [facility] to [facility] in [place].  PCP: Cleared TXA: IV Allergies:  - lamotrigine - chills and rash.  Anesthesia Concerns: None.  BMI: 35.8 Last HgbA1c: Not diabetic.  Other: - Aspirin 81mg baseline. - Hx arthroscopy bilateral knees.  - OSA, uses bipap.  - Chronic low back pain, spine surgery x6.   - Oxycodone, zofran, methocarbamol, meloxicam.  - 02/17/23: Hgb 15.3, K+ 4.6, Cr. 0.99.    Patient's anticipated LOS is less than 2 midnights, meeting these requirements: - Younger than 65 - Lives within 1 hour of care - Has a competent adult at home to recover with post-op recover - NO history of  - Chronic pain requiring opiods  - Diabetes  - Coronary Artery Disease  - Heart failure  - Heart attack  - Stroke  - DVT/VTE  - Cardiac arrhythmia  - Respiratory Failure/COPD  - Renal failure  - Anemia  - Advanced Liver disease   

## 2023-03-02 ENCOUNTER — Ambulatory Visit (HOSPITAL_COMMUNITY): Payer: No Typology Code available for payment source

## 2023-03-02 ENCOUNTER — Encounter (HOSPITAL_COMMUNITY)
Admission: RE | Disposition: A | Discharge status: Home / Self Care | Payer: Self-pay | Source: Ambulatory Visit | Attending: Orthopedic Surgery

## 2023-03-02 ENCOUNTER — Encounter (HOSPITAL_COMMUNITY): Payer: Self-pay | Admitting: Orthopedic Surgery

## 2023-03-02 ENCOUNTER — Ambulatory Visit (HOSPITAL_COMMUNITY): Payer: No Typology Code available for payment source | Admitting: Certified Registered Nurse Anesthetist

## 2023-03-02 ENCOUNTER — Ambulatory Visit (HOSPITAL_COMMUNITY)
Admission: RE | Admit: 2023-03-02 | Discharge: 2023-03-03 | Disposition: A | Discharge status: Home / Self Care | Payer: No Typology Code available for payment source | Source: Ambulatory Visit | Attending: Orthopedic Surgery | Admitting: Orthopedic Surgery

## 2023-03-02 ENCOUNTER — Other Ambulatory Visit: Payer: Self-pay

## 2023-03-02 DIAGNOSIS — M1711 Unilateral primary osteoarthritis, right knee: Secondary | ICD-10-CM | POA: Diagnosis present

## 2023-03-02 DIAGNOSIS — Z981 Arthrodesis status: Secondary | ICD-10-CM | POA: Insufficient documentation

## 2023-03-02 DIAGNOSIS — I10 Essential (primary) hypertension: Secondary | ICD-10-CM

## 2023-03-02 DIAGNOSIS — G8918 Other acute postprocedural pain: Secondary | ICD-10-CM | POA: Insufficient documentation

## 2023-03-02 DIAGNOSIS — Z96651 Presence of right artificial knee joint: Secondary | ICD-10-CM

## 2023-03-02 DIAGNOSIS — G4733 Obstructive sleep apnea (adult) (pediatric): Secondary | ICD-10-CM | POA: Diagnosis not present

## 2023-03-02 DIAGNOSIS — Z87891 Personal history of nicotine dependence: Secondary | ICD-10-CM

## 2023-03-02 DIAGNOSIS — G4730 Sleep apnea, unspecified: Secondary | ICD-10-CM | POA: Diagnosis not present

## 2023-03-02 DIAGNOSIS — Z9989 Dependence on other enabling machines and devices: Secondary | ICD-10-CM

## 2023-03-02 HISTORY — PX: KNEE ARTHROPLASTY: SHX992

## 2023-03-02 SURGERY — ARTHROPLASTY, KNEE, TOTAL, USING IMAGELESS COMPUTER-ASSISTED NAVIGATION
Anesthesia: Spinal | Site: Knee | Laterality: Right

## 2023-03-02 MED ORDER — KETOROLAC TROMETHAMINE 30 MG/ML IJ SOLN
INTRAMUSCULAR | Status: DC | PRN
  Administered 2023-03-02: 30 mg via INTRAVENOUS

## 2023-03-02 MED ORDER — MEPERIDINE HCL 50 MG/ML IJ SOLN: 6.2500 mg | INTRAMUSCULAR | Status: DC | PRN

## 2023-03-02 MED ORDER — BUPIVACAINE IN DEXTROSE 0.75-8.25 % IT SOLN
INTRATHECAL | Status: DC | PRN
  Administered 2023-03-02: 1.8 mL via INTRATHECAL

## 2023-03-02 MED ORDER — ACETAMINOPHEN 500 MG PO TABS
1000.0000 mg | ORAL_TABLET | Freq: Once | ORAL | Status: AC
  Administered 2023-03-02: 1000 mg via ORAL
  Filled 2023-03-02: qty 2

## 2023-03-02 MED ORDER — VITAMIN E 45 MG (100 UNIT) PO CAPS
400.0000 [IU] | ORAL_CAPSULE | Freq: Every day | ORAL | Status: DC
  Administered 2023-03-03: 400 [IU] via ORAL
  Filled 2023-03-02: qty 4

## 2023-03-02 MED ORDER — FLUOXETINE HCL 20 MG PO TABS
60.0000 mg | ORAL_TABLET | Freq: Every day | ORAL | Status: DC
  Administered 2023-03-03: 60 mg via ORAL
  Filled 2023-03-02 (×2): qty 3

## 2023-03-02 MED ORDER — LIDOCAINE HCL (PF) 2 % IJ SOLN
INTRAMUSCULAR | Status: DC | PRN
  Administered 2023-03-02: 80 mg via INTRADERMAL

## 2023-03-02 MED ORDER — DOCUSATE SODIUM 100 MG PO CAPS
100.0000 mg | ORAL_CAPSULE | Freq: Two times a day (BID) | ORAL | Status: DC
  Administered 2023-03-02 – 2023-03-03 (×2): 100 mg via ORAL
  Filled 2023-03-02 (×2): qty 1

## 2023-03-02 MED ORDER — BUPIVACAINE HCL (PF) 0.25 % IJ SOLN
INTRAMUSCULAR | Status: AC
  Filled 2023-03-02: qty 30

## 2023-03-02 MED ORDER — CEFAZOLIN SODIUM-DEXTROSE 2-4 GM/100ML-% IV SOLN
2.0000 g | INTRAVENOUS | Status: AC
  Administered 2023-03-02: 2 g via INTRAVENOUS
  Filled 2023-03-02: qty 100

## 2023-03-02 MED ORDER — HYDROMORPHONE HCL 1 MG/ML IJ SOLN: 0.2500 mg | INTRAMUSCULAR | Status: DC | PRN

## 2023-03-02 MED ORDER — SODIUM CHLORIDE (PF) 0.9 % IJ SOLN
INTRAMUSCULAR | Status: AC
  Filled 2023-03-02: qty 10

## 2023-03-02 MED ORDER — ORAL CARE MOUTH RINSE: 15.0000 mL | Freq: Once | OROMUCOSAL | Status: AC

## 2023-03-02 MED ORDER — PHENYLEPHRINE HCL-NACL 20-0.9 MG/250ML-% IV SOLN
INTRAVENOUS | Status: DC | PRN
  Administered 2023-03-02: 30 ug/min via INTRAVENOUS

## 2023-03-02 MED ORDER — BUPIVACAINE-EPINEPHRINE 0.25% -1:200000 IJ SOLN
INTRAMUSCULAR | Status: DC | PRN
  Administered 2023-03-02: 30 mL

## 2023-03-02 MED ORDER — CEFAZOLIN SODIUM-DEXTROSE 2-4 GM/100ML-% IV SOLN
2.0000 g | Freq: Four times a day (QID) | INTRAVENOUS | Status: AC
  Administered 2023-03-02 – 2023-03-03 (×2): 2 g via INTRAVENOUS
  Filled 2023-03-02 (×2): qty 100

## 2023-03-02 MED ORDER — METHOCARBAMOL 500 MG PO TABS
500.0000 mg | ORAL_TABLET | Freq: Four times a day (QID) | ORAL | Status: DC | PRN
  Administered 2023-03-02: 500 mg via ORAL
  Filled 2023-03-02: qty 1

## 2023-03-02 MED ORDER — HYDROMORPHONE HCL 1 MG/ML IJ SOLN: 0.5000 mg | INTRAMUSCULAR | Status: DC | PRN

## 2023-03-02 MED ORDER — ALUM & MAG HYDROXIDE-SIMETH 200-200-20 MG/5ML PO SUSP: 30.0000 mL | ORAL | Status: DC | PRN

## 2023-03-02 MED ORDER — DEXAMETHASONE SODIUM PHOSPHATE 10 MG/ML IJ SOLN
INTRAMUSCULAR | Status: DC | PRN
  Administered 2023-03-02: 10 mg via INTRAVENOUS

## 2023-03-02 MED ORDER — ONDANSETRON HCL 4 MG/2ML IJ SOLN
INTRAMUSCULAR | Status: DC | PRN
  Administered 2023-03-02: 4 mg via INTRAVENOUS

## 2023-03-02 MED ORDER — POVIDONE-IODINE 10 % EX SWAB: 2.0000 | Freq: Once | CUTANEOUS | Status: DC

## 2023-03-02 MED ORDER — PROPOFOL 10 MG/ML IV BOLUS
INTRAVENOUS | Status: DC | PRN
  Administered 2023-03-02: 90 ug/kg/min via INTRAVENOUS

## 2023-03-02 MED ORDER — CHLORHEXIDINE GLUCONATE 0.12 % MT SOLN
15.0000 mL | Freq: Once | OROMUCOSAL | Status: AC
  Administered 2023-03-02: 15 mL via OROMUCOSAL

## 2023-03-02 MED ORDER — POLYETHYLENE GLYCOL 3350 17 G PO PACK: 17.0000 g | PACK | Freq: Every day | ORAL | Status: DC | PRN

## 2023-03-02 MED ORDER — MIDAZOLAM HCL 2 MG/2ML IJ SOLN
2.0000 mg | Freq: Once | INTRAMUSCULAR | Status: AC
  Administered 2023-03-02: 2 mg via INTRAVENOUS
  Filled 2023-03-02: qty 2

## 2023-03-02 MED ORDER — METOCLOPRAMIDE HCL 5 MG/ML IJ SOLN: 5.0000 mg | Freq: Three times a day (TID) | INTRAMUSCULAR | Status: DC | PRN

## 2023-03-02 MED ORDER — LACTATED RINGERS IV SOLN: INTRAVENOUS | Status: DC

## 2023-03-02 MED ORDER — TRANEXAMIC ACID-NACL 1000-0.7 MG/100ML-% IV SOLN
1000.0000 mg | INTRAVENOUS | Status: DC
  Filled 2023-03-02: qty 100

## 2023-03-02 MED ORDER — OXYCODONE HCL 5 MG PO TABS
5.0000 mg | ORAL_TABLET | ORAL | Status: DC | PRN
  Filled 2023-03-02: qty 2

## 2023-03-02 MED ORDER — PROMETHAZINE HCL 25 MG/ML IJ SOLN: 6.2500 mg | INTRAMUSCULAR | Status: DC | PRN

## 2023-03-02 MED ORDER — KETOROLAC TROMETHAMINE 30 MG/ML IJ SOLN: 30.0000 mg | Freq: Once | INTRAMUSCULAR | Status: DC | PRN

## 2023-03-02 MED ORDER — LIDOCAINE HCL (PF) 2 % IJ SOLN
INTRAMUSCULAR | Status: AC
  Filled 2023-03-02: qty 5

## 2023-03-02 MED ORDER — ONDANSETRON HCL 4 MG/2ML IJ SOLN: 4.0000 mg | Freq: Four times a day (QID) | INTRAMUSCULAR | Status: DC | PRN

## 2023-03-02 MED ORDER — PROPOFOL 500 MG/50ML IV EMUL
INTRAVENOUS | Status: DC | PRN
  Administered 2023-03-02: 100 mg via INTRAVENOUS

## 2023-03-02 MED ORDER — ACETAMINOPHEN 325 MG PO TABS
325.0000 mg | ORAL_TABLET | Freq: Four times a day (QID) | ORAL | Status: DC | PRN
  Administered 2023-03-03: 650 mg via ORAL
  Filled 2023-03-02: qty 2

## 2023-03-02 MED ORDER — METHOCARBAMOL 1000 MG/10ML IJ SOLN: 500.0000 mg | Freq: Four times a day (QID) | INTRAVENOUS | Status: DC | PRN

## 2023-03-02 MED ORDER — DEXAMETHASONE SODIUM PHOSPHATE 10 MG/ML IJ SOLN
10.0000 mg | Freq: Once | INTRAMUSCULAR | Status: AC
  Administered 2023-03-03: 10 mg via INTRAVENOUS
  Filled 2023-03-02: qty 1

## 2023-03-02 MED ORDER — PRONTOSAN WOUND IRRIGATION OPTIME
TOPICAL | Status: DC | PRN
  Administered 2023-03-02: 1 via TOPICAL

## 2023-03-02 MED ORDER — ASPIRIN 81 MG PO CHEW
81.0000 mg | CHEWABLE_TABLET | Freq: Two times a day (BID) | ORAL | Status: DC
  Administered 2023-03-02 – 2023-03-03 (×2): 81 mg via ORAL
  Filled 2023-03-02 (×2): qty 1

## 2023-03-02 MED ORDER — ORAL CARE MOUTH RINSE: 15.0000 mL | OROMUCOSAL | Status: DC | PRN

## 2023-03-02 MED ORDER — CLONIDINE HCL (ANALGESIA) 100 MCG/ML EP SOLN
EPIDURAL | Status: DC | PRN
  Administered 2023-03-02: 100 ug

## 2023-03-02 MED ORDER — ROPIVACAINE HCL 5 MG/ML IJ SOLN
INTRAMUSCULAR | Status: DC | PRN
  Administered 2023-03-02 (×6): 5 mL via PERINEURAL

## 2023-03-02 MED ORDER — OXYCODONE HCL 5 MG PO TABS
10.0000 mg | ORAL_TABLET | ORAL | Status: DC | PRN
  Administered 2023-03-02: 10 mg via ORAL
  Administered 2023-03-03 (×2): 15 mg via ORAL
  Administered 2023-03-03: 10 mg via ORAL
  Filled 2023-03-02 (×3): qty 3

## 2023-03-02 MED ORDER — ONDANSETRON HCL 4 MG PO TABS: 4.0000 mg | ORAL_TABLET | Freq: Four times a day (QID) | ORAL | Status: DC | PRN

## 2023-03-02 MED ORDER — FENTANYL CITRATE PF 50 MCG/ML IJ SOSY
100.0000 ug | PREFILLED_SYRINGE | Freq: Once | INTRAMUSCULAR | Status: AC
  Administered 2023-03-02: 100 ug via INTRAVENOUS
  Filled 2023-03-02: qty 2

## 2023-03-02 MED ORDER — SODIUM CHLORIDE 0.9 % IR SOLN
Status: DC | PRN
  Administered 2023-03-02: 1000 mL

## 2023-03-02 MED ORDER — DIPHENHYDRAMINE HCL 12.5 MG/5ML PO ELIX: 12.5000 mg | ORAL_SOLUTION | ORAL | Status: DC | PRN

## 2023-03-02 MED ORDER — SODIUM CHLORIDE 0.9 % IV SOLN: INTRAVENOUS | Status: DC

## 2023-03-02 MED ORDER — PHENOL 1.4 % MT LIQD: 1.0000 | OROMUCOSAL | Status: DC | PRN

## 2023-03-02 MED ORDER — ISOPROPYL ALCOHOL 70 % SOLN
Status: DC | PRN
  Administered 2023-03-02: 1 via TOPICAL

## 2023-03-02 MED ORDER — KETOROLAC TROMETHAMINE 30 MG/ML IJ SOLN
INTRAMUSCULAR | Status: AC
  Filled 2023-03-02: qty 1

## 2023-03-02 MED ORDER — MENTHOL 3 MG MT LOZG: 1.0000 | LOZENGE | OROMUCOSAL | Status: DC | PRN

## 2023-03-02 MED ORDER — ONDANSETRON HCL 4 MG/2ML IJ SOLN
INTRAMUSCULAR | Status: AC
  Filled 2023-03-02: qty 2

## 2023-03-02 MED ORDER — SODIUM CHLORIDE (PF) 0.9 % IJ SOLN
INTRAMUSCULAR | Status: DC | PRN
  Administered 2023-03-02: 30 mL

## 2023-03-02 MED ORDER — EPINEPHRINE PF 1 MG/ML IJ SOLN
INTRAMUSCULAR | Status: AC
  Filled 2023-03-02: qty 1

## 2023-03-02 MED ORDER — BUPROPION HCL ER (XL) 300 MG PO TB24
300.0000 mg | ORAL_TABLET | Freq: Every day | ORAL | Status: DC
  Administered 2023-03-03: 300 mg via ORAL
  Filled 2023-03-02: qty 1

## 2023-03-02 MED ORDER — CELECOXIB 200 MG PO CAPS
200.0000 mg | ORAL_CAPSULE | Freq: Two times a day (BID) | ORAL | Status: DC
  Administered 2023-03-02 – 2023-03-03 (×2): 200 mg via ORAL
  Filled 2023-03-02 (×2): qty 1

## 2023-03-02 MED ORDER — SENNA 8.6 MG PO TABS
1.0000 | ORAL_TABLET | Freq: Two times a day (BID) | ORAL | Status: DC
  Administered 2023-03-02 – 2023-03-03 (×2): 8.6 mg via ORAL
  Filled 2023-03-02 (×2): qty 1

## 2023-03-02 MED ORDER — METOCLOPRAMIDE HCL 5 MG PO TABS: 5.0000 mg | ORAL_TABLET | Freq: Three times a day (TID) | ORAL | Status: DC | PRN

## 2023-03-02 MED ORDER — DEXAMETHASONE SODIUM PHOSPHATE 10 MG/ML IJ SOLN
INTRAMUSCULAR | Status: AC
  Filled 2023-03-02: qty 1

## 2023-03-02 SURGICAL SUPPLY — 71 items
ADH SKN CLS APL DERMABOND .7 (GAUZE/BANDAGES/DRESSINGS) ×2
APL PRP STRL LF DISP 70% ISPRP (MISCELLANEOUS) ×2
BAG COUNTER SPONGE SURGICOUNT (BAG) IMPLANT
BAG SPEC THK2 15X12 ZIP CLS (MISCELLANEOUS) ×1
BAG SPNG CNTER NS LX DISP (BAG) ×1
BAG ZIPLOCK 12X15 (MISCELLANEOUS) IMPLANT
BATTERY INSTRU NAVIGATION (MISCELLANEOUS) ×3 IMPLANT
BLADE SAW RECIPROCATING 77.5 (BLADE) ×1 IMPLANT
BNDG CMPR 5X4 KNIT ELC UNQ LF (GAUZE/BANDAGES/DRESSINGS) ×1
BNDG CMPR 5X62 HK CLSR LF (GAUZE/BANDAGES/DRESSINGS) ×1
BNDG ELASTIC 4INX 5YD STR LF (GAUZE/BANDAGES/DRESSINGS) ×1 IMPLANT
BNDG ELASTIC 6INX 5YD STR LF (GAUZE/BANDAGES/DRESSINGS) ×1 IMPLANT
BTRY SRG DRVR LF (MISCELLANEOUS) ×3
CHLORAPREP W/TINT 26 (MISCELLANEOUS) ×2 IMPLANT
COMP TIB PS KNEE 0D H RT (Joint) ×1 IMPLANT
COMPONET TIB PS KNEE 0D H RT (Joint) IMPLANT
COVER SURGICAL LIGHT HANDLE (MISCELLANEOUS) ×1 IMPLANT
DERMABOND ADVANCED .7 DNX12 (GAUZE/BANDAGES/DRESSINGS) ×2 IMPLANT
DRAPE SHEET LG 3/4 BI-LAMINATE (DRAPES) ×3 IMPLANT
DRAPE U-SHAPE 47X51 STRL (DRAPES) ×1 IMPLANT
DRSG AQUACEL AG ADV 3.5X10 (GAUZE/BANDAGES/DRESSINGS) ×1 IMPLANT
ELECT BLADE TIP CTD 4 INCH (ELECTRODE) ×1 IMPLANT
ELECT REM PT RETURN 15FT ADLT (MISCELLANEOUS) ×1 IMPLANT
FILTER STRAW (MISCELLANEOUS) IMPLANT
GAUZE SPONGE 4X4 12PLY STRL (GAUZE/BANDAGES/DRESSINGS) ×1 IMPLANT
GLOVE BIO SURGEON STRL SZ7 (GLOVE) ×1 IMPLANT
GLOVE BIO SURGEON STRL SZ8.5 (GLOVE) ×2 IMPLANT
GLOVE BIOGEL PI IND STRL 7.5 (GLOVE) ×1 IMPLANT
GLOVE BIOGEL PI IND STRL 8.5 (GLOVE) ×1 IMPLANT
GOWN SPEC L3 XXLG W/TWL (GOWN DISPOSABLE) ×1 IMPLANT
GOWN STRL REUS W/ TWL XL LVL3 (GOWN DISPOSABLE) ×1 IMPLANT
GOWN STRL REUS W/TWL XL LVL3 (GOWN DISPOSABLE) ×1
HANDPIECE INTERPULSE COAX TIP (DISPOSABLE) ×1
HOLDER FOLEY CATH W/STRAP (MISCELLANEOUS) ×1 IMPLANT
HOOD PEEL AWAY T7 (MISCELLANEOUS) ×3 IMPLANT
INSERT TIB ASF PS 7-12 10 GH (Insert) IMPLANT
KIT TURNOVER KIT A (KITS) IMPLANT
MARKER SKIN DUAL TIP RULER LAB (MISCELLANEOUS) ×1 IMPLANT
NDL SAFETY ECLIP 18X1.5 (MISCELLANEOUS) ×1 IMPLANT
NDL SIDE PORT 18GA QUICK PRESS (NEEDLE) IMPLANT
NDL SPNL 18GX3.5 QUINCKE PK (NEEDLE) ×1 IMPLANT
NEEDLE SPNL 18GX3.5 QUINCKE PK (NEEDLE) ×1 IMPLANT
NS IRRIG 1000ML POUR BTL (IV SOLUTION) ×1 IMPLANT
PACK TOTAL KNEE CUSTOM (KITS) ×1 IMPLANT
PADDING CAST COTTON 6X4 STRL (CAST SUPPLIES) ×1 IMPLANT
PATELLA STD SZ 38X10 (Miscellaneous) IMPLANT
PIN DRILL HDLS TROCAR 75 4PK (PIN) IMPLANT
PROS FEM KNEE PS STD 9 RT (Joint) ×1 IMPLANT
PROSTHESIS FEM KNEE PS STD9 RT (Joint) IMPLANT
PROTECTOR NERVE ULNAR (MISCELLANEOUS) ×1 IMPLANT
SAW OSC TIP CART 19.5X105X1.3 (SAW) ×1 IMPLANT
SCREW FEMALE HEX FIX 25X2.5 (ORTHOPEDIC DISPOSABLE SUPPLIES) IMPLANT
SEALER BIPOLAR AQUA 6.0 (INSTRUMENTS) ×1 IMPLANT
SET HNDPC FAN SPRY TIP SCT (DISPOSABLE) ×1 IMPLANT
SET PAD KNEE POSITIONER (MISCELLANEOUS) ×1 IMPLANT
SOLUTION PRONTOSAN WOUND 350ML (IRRIGATION / IRRIGATOR) IMPLANT
SPIKE FLUID TRANSFER (MISCELLANEOUS) ×2 IMPLANT
SUT MNCRL AB 3-0 PS2 18 (SUTURE) ×1 IMPLANT
SUT MON AB 2-0 CT1 36 (SUTURE) ×1 IMPLANT
SUT STRATAFIX PDO 1 14 VIOLET (SUTURE) ×1
SUT STRATFX PDO 1 14 VIOLET (SUTURE) ×1
SUT VIC AB 1 CTX 36 (SUTURE) ×2
SUT VIC AB 1 CTX36XBRD ANBCTR (SUTURE) ×2 IMPLANT
SUT VIC AB 2-0 CT1 27 (SUTURE) ×1
SUT VIC AB 2-0 CT1 TAPERPNT 27 (SUTURE) ×1 IMPLANT
SUTURE STRATFX PDO 1 14 VIOLET (SUTURE) ×1 IMPLANT
SYR 27GX1/2 1ML LL SAFETY (SYRINGE) IMPLANT
SYR 3ML LL SCALE MARK (SYRINGE) ×1 IMPLANT
TRAY FOLEY MTR SLVR 16FR STAT (SET/KITS/TRAYS/PACK) IMPLANT
TUBE SUCTION HIGH CAP CLEAR NV (SUCTIONS) ×1 IMPLANT
WATER STERILE IRR 1000ML POUR (IV SOLUTION) ×2 IMPLANT

## 2023-03-02 NOTE — Anesthesia Procedure Notes (Signed)
Spinal  Patient location during procedure: OR Start time: 03/02/2023 2:49 PM End time: 03/02/2023 2:52 PM Reason for block: surgical anesthesia Staffing Performed: anesthesiologist  Anesthesiologist: Leilani Able, MD Performed by: Leilani Able, MD Authorized by: Leilani Able, MD   Preanesthetic Checklist Completed: patient identified, IV checked, site marked, risks and benefits discussed, surgical consent, monitors and equipment checked, pre-op evaluation and timeout performed Spinal Block Patient position: sitting Prep: DuraPrep and site prepped and draped Patient monitoring: continuous pulse ox and blood pressure Approach: midline Location: L3-4 Injection technique: single-shot Needle Needle type: Pencan  Needle gauge: 24 G Needle length: 10 cm Needle insertion depth: 8 cm Assessment Sensory level: T6 Events: CSF return

## 2023-03-02 NOTE — Anesthesia Procedure Notes (Signed)
Anesthesia Regional Block: Adductor canal block   Pre-Anesthetic Checklist: , timeout performed,  Correct Patient, Correct Site, Correct Laterality,  Correct Procedure, Correct Position, site marked,  Risks and benefits discussed,  Surgical consent,  Pre-op evaluation,  At surgeon's request and post-op pain management  Laterality: Lower and Right  Prep: chloraprep       Needles:  Injection technique: Single-shot  Needle Type: Echogenic Stimulator Needle     Needle Length: 9cm  Needle Gauge: 20   Needle insertion depth: 3.5 cm   Additional Needles:   Procedures:,,,, ultrasound used (permanent image in chart),,    Narrative:  Start time: 03/02/2023 1:50 PM End time: 03/02/2023 2:00 PM Anesthesiologist: Leilani Able, MD

## 2023-03-02 NOTE — Progress Notes (Signed)
   03/02/23 2222  BiPAP/CPAP/SIPAP  $ Non-Invasive Home Ventilator  Initial  BiPAP/CPAP/SIPAP Pt Type Adult  BiPAP/CPAP/SIPAP Resmed  Mask Type Full face mask  Mask Size Large (home mask)  Respiratory Rate 18 breaths/min  Flow Rate 2 lpm  Patient Home Equipment No  Auto Titrate Yes (5-20 cmH2O)

## 2023-03-02 NOTE — Interval H&P Note (Signed)
History and Physical Interval Note:  03/02/2023 11:44 AM  Juan Long  has presented today for surgery, with the diagnosis of Right knee osteoarthritis.  The various methods of treatment have been discussed with the patient and family. After consideration of risks, benefits and other options for treatment, the patient has consented to  Procedure(s): COMPUTER ASSISTED TOTAL KNEE ARTHROPLASTY (Right) as a surgical intervention.  The patient's history has been reviewed, patient examined, no change in status, stable for surgery.  I have reviewed the patient's chart and labs.  Questions were answered to the patient's satisfaction.     Juan Long

## 2023-03-02 NOTE — Anesthesia Postprocedure Evaluation (Signed)
Anesthesia Post Note  Patient: Juan Long  Procedure(s) Performed: COMPUTER ASSISTED TOTAL KNEE ARTHROPLASTY (Right: Knee)     Patient location during evaluation: PACU Anesthesia Type: Spinal Level of consciousness: awake Pain management: pain level controlled Vital Signs Assessment: post-procedure vital signs reviewed and stable Respiratory status: spontaneous breathing Cardiovascular status: stable Postop Assessment: no headache, no backache, spinal receding, patient able to bend at knees and no apparent nausea or vomiting Anesthetic complications: no  No notable events documented.  Last Vitals:  Vitals:   03/02/23 1830 03/02/23 1845  BP: 105/69 103/63  Pulse: 69 67  Resp: 15 17  Temp: (!) 36.4 C 36.4 C  SpO2: 98% 100%    Last Pain:  Vitals:   03/02/23 1859  TempSrc:   PainSc: 0-No pain                 Caren Macadam

## 2023-03-02 NOTE — Op Note (Signed)
OPERATIVE REPORT  SURGEON: Samson Frederic, MD   ASSISTANT: Clint Bolder, PA-C  PREOPERATIVE DIAGNOSIS: Primary Right knee arthritis.   POSTOPERATIVE DIAGNOSIS: Primary Right knee arthritis.   PROCEDURE: Computer assisted Right total knee arthroplasty.   IMPLANTS: Zimmer Persona PPS Cementless CR femur, size 9. Persona 0 degree Spiked Keel OsseoTi Tibia, size H. Vivacit-E polyethelyene insert, size 10 mm, CR. TM standard patella, size 38 mm.  ANESTHESIA:  MAC, Spinal, and Epidural  TOURNIQUET TIME: Not utilized.   ESTIMATED BLOOD LOSS: 350 mL.  ANTIBIOTICS: 2g Ancef.  DRAINS: None.  COMPLICATIONS: None   CONDITION: PACU - hemodynamically stable.   BRIEF CLINICAL NOTE: Juan Long is a 47 y.o. male with a long-standing history of Right knee arthritis. After failing conservative management, the patient was indicated for total knee arthroplasty. The risks, benefits, and alternatives to the procedure were explained, and the patient elected to proceed.  PROCEDURE IN DETAIL: Adductor canal block was obtained in the pre-op holding area. Once inside the operative room, spinal anesthesia was obtained, and a foley catheter was inserted. The patient was then positioned and the lower extremity was prepped and draped in the normal sterile surgical fashion.  A time-out was called verifying side and site of surgery. The patient received IV antibiotics within 60 minutes of beginning the procedure. A tourniquet was not utilized.   An anterior approach to the knee was performed utilizing a midvastus arthrotomy. A medial release was performed and the patellar fat pad was excised. Stryker imageless navigation was used to cut the distal femur perpendicular to the mechanical axis. A freehand patellar resection was performed, and the patella was sized an prepared with 3 lug holes.  Nagivation was used to make a neutral proximal tibia resection, taking 6 mm of bone from the less affected medial side  with 3 degrees of slope. The menisci were excised. A spacer block was placed, and the alignment and balance in extension were confirmed.   The distal femur was sized using the 3-degree external rotation guide referencing the posterior femoral cortex. The appropriate 4-in-1 cutting block was pinned into place. Rotation was checked using Whiteside's line, the epicondylar axis, and then confirmed with a spacer block in flexion. The remaining femoral cuts were performed, taking care to protect the MCL.  The tibia was sized and the trial tray was pinned into place. The remaining trail components were inserted. The knee was stable to varus and valgus stress through a full range of motion. The patella tracked centrally, and the PCL was well balanced. The trial components were removed, and the proximal tibial surface was prepared. Final components were impacted into place. The knee was tested for a final time and found to be well balanced.   The wound was copiously irrigated with Irrisept solution and normal saline using pule lavage.  Marcaine solution was injected into the periarticular soft tissue.  The wound was closed in layers using #1 Vicryl and Stratafix for the fascia, 2-0 Vicryl for the subcutaneous fat, 2-0 Monocryl for the deep dermal layer, 3-0 running Monocryl subcuticular Stitch, and 4-0 Monocryl stay sutures at both ends of the wound. Dermabond was applied to the skin.  Once the glue was fully dried, an Aquacell Ag and compressive dressing were applied.  The patient was transported to the recovery room in stable condition.  Sponge, needle, and instrument counts were correct at the end of the case x2.  The patient tolerated the procedure well and there were no known complications.  The aquamantis was utilized for this case to help facilitate better hemostasis as patient was felt to be at increased risk of bleeding because of complex case requiring increased OR time and/or exposure - no tourniquet  use.  A oscillating saw tip was utilized for this case to prevent damage to the soft tissue structures such as muscles, ligaments and tendons, and to ensure accurate bone cuts. This patient was at increased risk for above structures due to  minimally invasive approach.  Please note that a surgical assistant was a medical necessity for this procedure in order to perform it in a safe and expeditious manner. Surgical assistant was necessary to retract the ligaments and vital neurovascular structures to prevent injury to them and also necessary for proper positioning of the limb to allow for anatomic placement of the prosthesis.

## 2023-03-02 NOTE — Anesthesia Preprocedure Evaluation (Addendum)
Anesthesia Evaluation  Patient identified by MRN, date of birth, ID band Patient awake    Reviewed: Allergy & Precautions, H&P , NPO status , Patient's Chart, lab work & pertinent test results  Airway Mallampati: I       Dental no notable dental hx.    Pulmonary sleep apnea and Continuous Positive Airway Pressure Ventilation , former smoker   Pulmonary exam normal        Cardiovascular hypertension, Normal cardiovascular exam     Neuro/Psych  PSYCHIATRIC DISORDERS  Depression    negative neurological ROS     GI/Hepatic negative GI ROS, Neg liver ROS,,,  Endo/Other  negative endocrine ROS    Renal/GU negative Renal ROS  negative genitourinary   Musculoskeletal  (+) Arthritis , Osteoarthritis,    Abdominal   Peds negative pediatric ROS (+)  Hematology negative hematology ROS (+)   Anesthesia Other Findings   Reproductive/Obstetrics negative OB ROS                             Anesthesia Physical Anesthesia Plan  ASA: 2  Anesthesia Plan: Spinal   Post-op Pain Management: Regional block*   Induction:   PONV Risk Score and Plan: 1 and Ondansetron, Dexamethasone and Midazolam  Airway Management Planned: Natural Airway and Simple Face Mask  Additional Equipment: None  Intra-op Plan:   Post-operative Plan:   Informed Consent: I have reviewed the patients History and Physical, chart, labs and discussed the procedure including the risks, benefits and alternatives for the proposed anesthesia with the patient or authorized representative who has indicated his/her understanding and acceptance.     Dental advisory given  Plan Discussed with: CRNA  Anesthesia Plan Comments:        Anesthesia Quick Evaluation

## 2023-03-02 NOTE — Transfer of Care (Signed)
Immediate Anesthesia Transfer of Care Note  Patient: MAKIH STEFANKO  Procedure(s) Performed: COMPUTER ASSISTED TOTAL KNEE ARTHROPLASTY (Right: Knee)  Patient Location: PACU  Anesthesia Type:Spinal  Level of Consciousness: awake and alert   Airway & Oxygen Therapy: Patient Spontanous Breathing and Patient connected to face mask oxygen  Post-op Assessment: Report given to RN and Post -op Vital signs reviewed and stable  Post vital signs: Reviewed and stable  Last Vitals:  Vitals Value Taken Time  BP 108/70 03/02/23 1739  Temp    Pulse 69 03/02/23 1740  Resp 18 03/02/23 1740  SpO2 99 % 03/02/23 1740  Vitals shown include unvalidated device data.  Last Pain:  Vitals:   03/02/23 1430  TempSrc:   PainSc: Asleep         Complications: No notable events documented.

## 2023-03-02 NOTE — Discharge Instructions (Signed)
 Dr. Brian Swinteck Total Joint Specialist [facility] [address] [place], [place] [address] [phone]  TOTAL KNEE REPLACEMENT POSTOPERATIVE DIRECTIONS    Knee Rehabilitation, Guidelines Following Surgery  Results after knee surgery are often greatly improved when you follow the exercise, range of motion and muscle strengthening exercises prescribed by your doctor. Safety measures are also important to protect the knee from further injury. Any time any of these exercises cause you to have increased pain or swelling in your knee joint, decrease the amount until you are comfortable again and slowly increase them. If you have problems or questions, call your caregiver or physical therapist for advice.   WEIGHT BEARING Weight bearing as tolerated with assist device (walker, cane, etc) as directed, use it as long as suggested by your surgeon or therapist, typically at least 4-6 weeks.  HOME CARE INSTRUCTIONS  Remove items at home which could result in a fall. This includes throw rugs or furniture in walking pathways.  Continue medications as instructed at time of discharge. You may have some home medications which will be placed on hold until you complete the course of blood thinner medication.  You may start showering once you are discharged home but do not submerge the incision under water. Just pat the incision dry and apply a dry gauze dressing on daily. Walk with walker as instructed.  You may resume a sexual relationship in one month or when given the OK by your doctor.  Use walker as long as suggested by your caregivers. Avoid periods of inactivity such as sitting longer than an hour when not asleep. This helps prevent blood clots.  You may put full weight on your legs and walk as much as is comfortable.  You may return to work once you are cleared by your doctor.  Do not drive a car for 6 weeks or until released by you surgeon.  Do not drive while  taking narcotics.  Wear the elastic stockings for three weeks following surgery during the day but you may remove then at night. Make sure you keep all of your appointments after your operation with all of your doctors and caregivers. You should call the office at the above phone number and make an appointment for approximately two weeks after the date of your surgery. Do not remove your surgical dressing. The dressing is waterproof; you may take showers in 3 days, but do not take tub baths or submerge the dressing. Please pick up a stool softener and laxative for home use as long as you are requiring pain medications. ICE to the affected knee every three hours for 30 minutes at a time and then as needed for pain and swelling.  Continue to use ice on the knee for pain and swelling from surgery. You may notice swelling that will progress down to the foot and ankle.  This is normal after surgery.  Elevate the leg when you are not up walking on it.   It is important for you to complete the blood thinner medication as prescribed by your doctor. Continue to use the breathing machine which will help keep your temperature down.  It is common for your temperature to cycle up and down following surgery, especially at night when you are not up moving around and exerting yourself.  The breathing machine keeps your lungs expanded and your temperature down.  RANGE OF MOTION AND STRENGTHENING EXERCISES  Rehabilitation of the knee is important following a knee injury or an   operation. After just a few days of immobilization, the muscles of the thigh which control the knee become weakened and shrink (atrophy). Knee exercises are designed to build up the tone and strength of the thigh muscles and to improve knee motion. Often times heat used for twenty to thirty minutes before working out will loosen up your tissues and help with improving the range of motion but do not use heat for the first two weeks following surgery.  These exercises can be done on a training (exercise) mat, on the floor, on a table or on a bed. Use what ever works the best and is most comfortable for you Knee exercises include:  Leg Lifts - While your knee is still immobilized in a splint or cast, you can do straight leg raises. Lift the leg to 60 degrees, hold for 3 sec, and slowly lower the leg. Repeat 10-20 times 2-3 times daily. Perform this exercise against resistance later as your knee gets better.  Quad and Hamstring Sets - Tighten up the muscle on the front of the thigh (Quad) and hold for 5-10 sec. Repeat this 10-20 times hourly. Hamstring sets are done by pushing the foot backward against an object and holding for 5-10 sec. Repeat as with quad sets.  A rehabilitation program following serious knee injuries can speed recovery and prevent re-injury in the future due to weakened muscles. Contact your doctor or a physical therapist for more information on knee rehabilitation.   POST-OPERATIVE OPIOID TAPER INSTRUCTIONS: It is important to wean off of your opioid medication as soon as possible. If you do not need pain medication after your surgery it is ok to stop day one. Opioids include: Codeine, Hydrocodone(Norco, Vicodin), Oxycodone(Percocet, oxycontin) and hydromorphone amongst others.  Long term and even short term use of opiods can cause: Increased pain response Dependence Constipation Depression Respiratory depression And more.  Withdrawal symptoms can include Flu like symptoms Nausea, vomiting And more Techniques to manage these symptoms Hydrate well Eat regular healthy meals Stay active Use relaxation techniques(deep breathing, meditating, yoga) Do Not substitute Alcohol to help with tapering If you have been on opioids for less than two weeks and do not have pain than it is ok to stop all together.  Plan to wean off of opioids This plan should start within one week post op of your joint replacement. Maintain the same  interval or time between taking each dose and first decrease the dose.  Cut the total daily intake of opioids by one tablet each day Next start to increase the time between doses. The last dose that should be eliminated is the evening dose.    SKILLED REHAB INSTRUCTIONS: If the patient is transferred to a skilled rehab facility following release from the hospital, a list of the current medications will be sent to the facility for the patient to continue.  When discharged from the skilled rehab facility, please have the facility set up the patient's Home Health Physical Therapy prior to being released. Also, the skilled facility will be responsible for providing the patient with their medications at time of release from the facility to include their pain medication, the muscle relaxants, and their blood thinner medication. If the patient is still at the rehab facility at time of the two week follow up appointment, the skilled rehab facility will also need to assist the patient in arranging follow up appointment in our office and any transportation needs.  MAKE SURE YOU:  Understand these instructions.  Will watch   your condition.  Will get help right away if you are not doing well or get worse.    Pick up stool softner and laxative for home use following surgery while on pain medications. Do NOT remove your dressing. You may shower.  Do not take tub baths or submerge incision under water. May shower starting three days after surgery. Please use a clean towel to pat the incision dry following showers. Continue to use ice for pain and swelling after surgery. Do not use any lotions or creams on the incision until instructed by your surgeon.  

## 2023-03-03 ENCOUNTER — Encounter (HOSPITAL_COMMUNITY): Payer: Self-pay | Admitting: Orthopedic Surgery

## 2023-03-03 ENCOUNTER — Other Ambulatory Visit (HOSPITAL_COMMUNITY): Payer: Self-pay

## 2023-03-03 DIAGNOSIS — M1711 Unilateral primary osteoarthritis, right knee: Secondary | ICD-10-CM | POA: Diagnosis not present

## 2023-03-03 LAB — BASIC METABOLIC PANEL
Anion gap: 8 (ref 5–15)
BUN: 20 mg/dL (ref 6–20)
CO2: 25 mmol/L (ref 22–32)
Calcium: 8.7 mg/dL — ABNORMAL LOW (ref 8.9–10.3)
Chloride: 107 mmol/L (ref 98–111)
Creatinine, Ser: 0.96 mg/dL (ref 0.61–1.24)
GFR, Estimated: >60 mL/min (ref >60)
Glucose, Bld: 146 mg/dL — ABNORMAL HIGH (ref 70–99)
Potassium: 4.8 mmol/L (ref 3.5–5.1)
Sodium: 140 mmol/L (ref 135–145)

## 2023-03-03 LAB — CBC
HCT: 38.2 % — ABNORMAL LOW (ref 39.0–52.0)
Hemoglobin: 12.3 g/dL — ABNORMAL LOW (ref 13.0–17.0)
MCH: 29.3 pg (ref 26.0–34.0)
MCHC: 32.2 g/dL (ref 30.0–36.0)
MCV: 91 fL (ref 80.0–100.0)
Platelets: 272 10*3/uL (ref 150–400)
RBC: 4.2 MIL/uL — ABNORMAL LOW (ref 4.22–5.81)
RDW: 14.2 % (ref 11.5–15.5)
WBC: 10.2 10*3/uL (ref 4.0–10.5)
nRBC: 0 % (ref 0.0–0.2)

## 2023-03-03 MED ORDER — OXYCODONE HCL 5 MG PO TABS
5.0000 mg | ORAL_TABLET | ORAL | 0 refills | Status: AC | PRN
  Filled 2023-03-03: qty 42, 7d supply, fill #0

## 2023-03-03 MED ORDER — ONDANSETRON HCL 4 MG PO TABS
4.0000 mg | ORAL_TABLET | Freq: Three times a day (TID) | ORAL | 0 refills | Status: AC | PRN
  Filled 2023-03-03: qty 30, 10d supply, fill #0

## 2023-03-03 MED ORDER — ASPIRIN 81 MG PO CHEW
81.0000 mg | CHEWABLE_TABLET | Freq: Two times a day (BID) | ORAL | 0 refills | Status: AC
  Filled 2023-03-03: qty 90, 45d supply, fill #0

## 2023-03-03 MED ORDER — DOCUSATE SODIUM 100 MG PO CAPS
100.0000 mg | ORAL_CAPSULE | Freq: Two times a day (BID) | ORAL | 0 refills | Status: AC
  Filled 2023-03-03: qty 60, 30d supply, fill #0

## 2023-03-03 MED ORDER — METHOCARBAMOL 500 MG PO TABS
500.0000 mg | ORAL_TABLET | Freq: Four times a day (QID) | ORAL | 0 refills | Status: AC | PRN
  Filled 2023-03-03: qty 20, 5d supply, fill #0

## 2023-03-03 MED ORDER — SENNA 8.6 MG PO TABS
2.0000 | ORAL_TABLET | Freq: Every day | ORAL | 0 refills | Status: AC
  Filled 2023-03-03: qty 30, 15d supply, fill #0

## 2023-03-03 MED ORDER — POLYETHYLENE GLYCOL 3350 17 GM/SCOOP PO POWD
17.0000 g | Freq: Every day | ORAL | 0 refills | Status: AC | PRN
  Filled 2023-03-03: qty 238, 14d supply, fill #0

## 2023-03-03 NOTE — Progress Notes (Signed)
Physical Therapy Treatment Patient Details Name: Juan Long MRN: [id number] DOB: November 10, 1975 Today's Date: 03/03/2023   History of Present Illness Pt s/p R TKR and with hx of cervical fusion, lumbar lami, SI jt fusion and sponylisthesis    PT Comments    Pt very motivated and up to ambulate in hall, negotiated stairs and reviewed written HEP.  Pt eager for dc home this date.   Recommendations for follow up therapy are one component of a multi-disciplinary discharge planning process, led by the attending physician.  Recommendations may be updated based on patient status, additional functional criteria and insurance authorization.  Follow Up Recommendations       Assistance Recommended at Discharge PRN  Patient can return home with the following     Equipment Recommendations  None recommended by PT    Recommendations for Other Services       Precautions / Restrictions Precautions Precautions: Knee;Fall Restrictions Weight Bearing Restrictions: No RLE Weight Bearing: Weight bearing as tolerated     Mobility  Bed Mobility Overal bed mobility: Needs Assistance Bed Mobility: Rolling, Sidelying to Sit, Sit to Sidelying Rolling: Supervision Sidelying to sit: Supervision     Sit to sidelying: Supervision General bed mobility comments: modified roll 2* prior back surgeries    Transfers Overall transfer level: Needs assistance Equipment used: Rolling walker (2 wheels) Transfers: Sit to/from Stand Sit to Stand: Min guard           General transfer comment: cues for LE management and use of UEs to self assist    Ambulation/Gait Ambulation/Gait assistance: Supervision Gait Distance (Feet): 100 Feet Assistive device: Rolling walker (2 wheels) Gait Pattern/deviations: Step-to pattern, Decreased step length - right, Decreased step length - left, Shuffle, Trunk flexed Gait velocity: decr     General Gait Details: min cues for posture, position from RW and  sequence   Stairs Stairs: Yes Stairs assistance: Min guard Stair Management: No rails, Step to pattern, Forwards, With crutches Number of Stairs: 5 General stair comments: cues for sequence and crutch placement   Wheelchair Mobility    Modified Rankin (Stroke Patients Only)       Balance Overall balance assessment: Mild deficits observed, not formally tested                                          Cognition Arousal/Alertness: Awake/alert Behavior During Therapy: WFL for tasks assessed/performed Overall Cognitive Status: Within Functional Limits for tasks assessed                                          Exercises Total Joint Exercises Ankle Circles/Pumps: AROM, Both, 15 reps, Supine Quad Sets: AROM, Both, 10 reps, Supine Heel Slides: AAROM, Right, 15 reps, Supine Straight Leg Raises: AAROM, AROM, Right, 10 reps, Supine    General Comments        Pertinent Vitals/Pain Pain Assessment Pain Assessment: 0-10 Pain Score: 7  Pain Location: R knee Pain Descriptors / Indicators: Aching, Sore Pain Intervention(s): Limited activity within patient's tolerance, Monitored during session, Premedicated before session    Home Living Family/patient expects to be discharged to:: Private residence Living Arrangements: Spouse/significant other;Children Available Help at Discharge: Family;Available 24 hours/day Type of Home: House Home Access: Stairs to enter Entrance Stairs-Rails: None Entrance Stairs-Number of  Steps: 2   Home Layout: One level Home Equipment: Rolling Walker (2 wheels);Rollator (4 wheels);Cane - single point;Crutches Additional Comments: wife works from home    Prior Function            PT Goals (current goals can now be found in the care plan section) Acute Rehab PT Goals Patient Stated Goal: Regain IND PT Goal Formulation: With patient Time For Goal Achievement: 03/10/23 Potential to Achieve Goals: Good Progress  towards PT goals: Progressing toward goals    Frequency    7X/week      PT Plan Current plan remains appropriate    Co-evaluation              AM-PAC PT "6 Clicks" Mobility   Outcome Measure  Help needed turning from your back to your side while in a flat bed without using bedrails?: A Little Help needed moving from lying on your back to sitting on the side of a flat bed without using bedrails?: A Little Help needed moving to and from a bed to a chair (including a wheelchair)?: A Little Help needed standing up from a chair using your arms (e.g., wheelchair or bedside chair)?: A Little Help needed to walk in hospital room?: A Little Help needed climbing 3-5 steps with a railing? : A Little 6 Click Score: 18    End of Session Equipment Utilized During Treatment: Gait belt Activity Tolerance: Patient tolerated treatment well Patient left: in bed;with call bell/phone within reach;with bed alarm set Nurse Communication: Mobility status PT Visit Diagnosis: Difficulty in walking, not elsewhere classified (R26.2)     Time: 8119-1478 PT Time Calculation (min) (ACUTE ONLY): 20 min  Charges:  $Gait Training: 8-22 mins $Therapeutic Exercise: 8-22 mins                     Mauro Kaufmann PT Acute Rehabilitation Services Pager [phone] Office [phone]   Marylynn Rigdon 03/03/2023, 11:50 AM

## 2023-03-03 NOTE — Evaluation (Signed)
Physical Therapy Evaluation Patient Details Name: Juan Long MRN: [id number] DOB: 1976-09-13 Today's Date: 03/03/2023  History of Present Illness  Pt s/p R TKR and with hx of cervical fusion, lumbar lami, SI jt fusion and sponylisthesis  Clinical Impression  Pt s/p R TKR and presents with decreased R LE strength/ROM and post op pain limiting functional mobility.  Pt should progress to dc home with family assist.     Recommendations for follow up therapy are one component of a multi-disciplinary discharge planning process, led by the attending physician.  Recommendations may be updated based on patient status, additional functional criteria and insurance authorization.  Follow Up Recommendations       Assistance Recommended at Discharge PRN  Patient can return home with the following       Equipment Recommendations None recommended by PT  Recommendations for Other Services       Functional Status Assessment Patient has had a recent decline in their functional status and demonstrates the ability to make significant improvements in function in a reasonable and predictable amount of time.     Precautions / Restrictions Precautions Precautions: Knee;Fall Restrictions Weight Bearing Restrictions: No RLE Weight Bearing: Weight bearing as tolerated      Mobility  Bed Mobility Overal bed mobility: Needs Assistance Bed Mobility: Rolling, Sidelying to Sit, Sit to Sidelying Rolling: Supervision Sidelying to sit: Min guard     Sit to sidelying: Min guard General bed mobility comments: modified roll 2* prior back surgeries with min guard for R LE management    Transfers Overall transfer level: Needs assistance Equipment used: Rolling walker (2 wheels) Transfers: Sit to/from Stand Sit to Stand: Min guard           General transfer comment: cues for LE management and use of UEs to self assist    Ambulation/Gait Ambulation/Gait assistance: Min guard Gait Distance  (Feet): 100 Feet Assistive device: Rolling walker (2 wheels) Gait Pattern/deviations: Step-to pattern, Decreased step length - right, Decreased step length - left, Shuffle, Trunk flexed Gait velocity: decr     General Gait Details: cues for posture, position from RW and sequence  Stairs            Wheelchair Mobility    Modified Rankin (Stroke Patients Only)       Balance Overall balance assessment: Mild deficits observed, not formally tested                                           Pertinent Vitals/Pain Pain Assessment Pain Assessment: 0-10 Pain Score: 7  Pain Location: R knee Pain Descriptors / Indicators: Aching, Sore Pain Intervention(s): Limited activity within patient's tolerance, Monitored during session, Premedicated before session, Ice applied    Home Living Family/patient expects to be discharged to:: Private residence Living Arrangements: Spouse/significant other;Children Available Help at Discharge: Family;Available 24 hours/day Type of Home: House Home Access: Stairs to enter Entrance Stairs-Rails: None Entrance Stairs-Number of Steps: 2   Home Layout: One level Home Equipment: Agricultural consultant (2 wheels);Rollator (4 wheels);Cane - single point;Crutches Additional Comments: wife works from home    Prior Function Prior Level of Function : Independent/Modified Independent             Mobility Comments: using cane or rollator as needed       Hand Dominance   Dominant Hand: Right    Extremity/Trunk Assessment  Upper Extremity Assessment Upper Extremity Assessment: Overall WFL for tasks assessed    Lower Extremity Assessment Lower Extremity Assessment: RLE deficits/detail RLE Deficits / Details: AAROM -4 - 90 with IND SLR       Communication      Cognition Arousal/Alertness: Awake/alert Behavior During Therapy: WFL for tasks assessed/performed Overall Cognitive Status: Within Functional Limits for tasks  assessed                                          General Comments      Exercises Total Joint Exercises Ankle Circles/Pumps: AROM, Both, 15 reps, Supine Quad Sets: AROM, Both, 10 reps, Supine Heel Slides: AAROM, Right, 15 reps, Supine Straight Leg Raises: AAROM, AROM, Right, 10 reps, Supine   Assessment/Plan    PT Assessment Patient needs continued PT services  PT Problem List Decreased strength;Decreased range of motion;Decreased activity tolerance;Decreased balance;Decreased mobility;Decreased knowledge of use of DME;Pain       PT Treatment Interventions DME instruction;Gait training;Stair training;Functional mobility training;Therapeutic activities;Therapeutic exercise;Patient/family education    PT Goals (Current goals can be found in the Care Plan section)  Acute Rehab PT Goals Patient Stated Goal: Regain IND PT Goal Formulation: With patient Time For Goal Achievement: 03/10/23 Potential to Achieve Goals: Good    Frequency 7X/week     Co-evaluation               AM-PAC PT "6 Clicks" Mobility  Outcome Measure Help needed turning from your back to your side while in a flat bed without using bedrails?: A Little Help needed moving from lying on your back to sitting on the side of a flat bed without using bedrails?: A Little Help needed moving to and from a bed to a chair (including a wheelchair)?: A Little Help needed standing up from a chair using your arms (e.g., wheelchair or bedside chair)?: A Little Help needed to walk in hospital room?: A Little Help needed climbing 3-5 steps with a railing? : A Lot 6 Click Score: 17    End of Session Equipment Utilized During Treatment: Gait belt Activity Tolerance: Patient tolerated treatment well Patient left: in bed;with call bell/phone within reach;with bed alarm set Nurse Communication: Mobility status PT Visit Diagnosis: Difficulty in walking, not elsewhere classified (R26.2)    Time:  3244-0102 PT Time Calculation (min) (ACUTE ONLY): 42 min   Charges:   PT Evaluation $PT Eval Low Complexity: 1 Low PT Treatments $Therapeutic Exercise: 8-22 mins        Mauro Kaufmann PT Acute Rehabilitation Services Pager [phone] Office [phone]   Ranette Luckadoo 03/03/2023, 11:23 AM

## 2023-03-03 NOTE — Discharge Summary (Signed)
Physician Discharge Summary  Patient ID: Juan Long MRN: [id number] DOB/AGE: [birth year] 47 y.o.  Admit date: 03/02/2023 Discharge date: 03/03/2023  Admission Diagnoses:  Osteoarthritis of right knee  Discharge Diagnoses:  Principal Problem:   Osteoarthritis of right knee Active Problems:   Degenerative arthritis of right knee   Past Medical History:  Diagnosis Date   Arthritis    Depression    Essential hypertension, benign    many years ago  no meds   Obstructive sleep apnea (adult) (pediatric)    Bipap   OSA on CPAP    Spondylisthesis     Surgeries: Procedure(s): COMPUTER ASSISTED TOTAL KNEE ARTHROPLASTY on 03/02/2023   Consultants (if any):   Discharged Condition: Improved  Hospital Course: Juan Long is an 47 y.o. male who was admitted 03/02/2023 with a diagnosis of Osteoarthritis of right knee and went to the operating room on 03/02/2023 and underwent the above named procedures.    He was given perioperative antibiotics:  Anti-infectives (From admission, onward)    Start     Dose/Rate Route Frequency Ordered Stop   03/02/23 2100  ceFAZolin (ANCEF) IVPB 2g/100 mL premix        2 g 200 mL/hr over 30 Minutes Intravenous Every 6 hours 03/02/23 1842 03/03/23 0434   03/02/23 1130  ceFAZolin (ANCEF) IVPB 2g/100 mL premix        2 g 200 mL/hr over 30 Minutes Intravenous On call to O.R. 03/02/23 1121 03/02/23 1510       He was given sequential compression devices, early ambulation, and aspirin for DVT prophylaxis.  POD#1 He ambulated 100 feet x2 with PT. Discharged home with OPPT.   He benefited maximally from the hospital stay and there were no complications.    Recent vital signs:  Vitals:   03/03/23 0535 03/03/23 0947  BP: (!) 110/53 116/67  Pulse: 67 67  Resp: 17 18  Temp: (!) 97.5 F (36.4 C) 98.1 F (36.7 C)  SpO2: 97% 98%    Recent laboratory studies:  Lab Results  Component Value Date   HGB 12.3 (L) 03/03/2023   HGB 15.3  02/17/2023   HGB 14.3 08/19/2021   Lab Results  Component Value Date   WBC 10.2 03/03/2023   PLT 272 03/03/2023   Lab Results  Component Value Date   INR 0.9 07/16/2021   Lab Results  Component Value Date   NA 140 03/03/2023   K 4.8 03/03/2023   CL 107 03/03/2023   CO2 25 03/03/2023   BUN 20 03/03/2023   CREATININE 0.96 03/03/2023   GLUCOSE 146 (H) 03/03/2023     Allergies as of 03/03/2023       Reactions   Lamotrigine Rash   Eruption, Chill        Medication List     STOP taking these medications    aspirin EC 81 MG tablet Replaced by: Aspirin Low Dose 81 MG chewable tablet   HYDROcodone-acetaminophen 10-325 MG tablet Commonly known as: NORCO   naproxen 500 MG tablet Commonly known as: NAPROSYN       TAKE these medications    Aspirin Low Dose 81 MG chewable tablet Generic drug: aspirin Chew 1 tablet (81 mg total) by mouth 2 (two) times daily with a meal. Replaces: aspirin EC 81 MG tablet   buPROPion 300 MG 24 hr tablet Commonly known as: WELLBUTRIN XL Take 300 mg by mouth in the morning.   docusate sodium 100 MG capsule Commonly known as: Colace Take  1 capsule (100 mg total) by mouth 2 (two) times daily.   FLUoxetine 20 MG tablet Commonly known as: PROZAC Take 60 mg by mouth daily.   LUBRICANT EYE DROPS OP Place 1 drop into both eyes 3 (three) times daily as needed (dry eyes).   methocarbamol 500 MG tablet Commonly known as: ROBAXIN Take 1 tablet (500 mg total) by mouth every 6 (six) hours as needed for muscle spasms.   ondansetron 4 MG tablet Commonly known as: Zofran Take 1 tablet (4 mg total) by mouth every 8 (eight) hours as needed for nausea or vomiting.   oxyCODONE 5 MG immediate release tablet Commonly known as: Roxicodone Take 1 tablet (5 mg total) by mouth every 4 (four) hours as needed for severe pain.   polyethylene glycol powder 17 GM/SCOOP powder Commonly known as: MiraLax Take 17 g by mouth daily as needed for mild  constipation or moderate constipation. Mix powder as directed.   senna 8.6 MG Tabs tablet Commonly known as: SENOKOT Take 2 tablets (17.2 mg total) by mouth at bedtime for 15 days.   vitamin E 180 MG (400 UNITS) capsule Take 400 Units by mouth daily.               Discharge Care Instructions  (From admission, onward)           Start     Ordered   03/03/23 0000  Weight bearing as tolerated        03/03/23 0736   03/03/23 0000  Change dressing       Comments: Do not remove your dressing.   03/03/23 0736              WEIGHT BEARING   Weight bearing as tolerated with assist device (walker, cane, etc) as directed, use it as long as suggested by your surgeon or therapist, typically at least 4-6 weeks.   EXERCISES  Results after joint replacement surgery are often greatly improved when you follow the exercise, range of motion and muscle strengthening exercises prescribed by your doctor. Safety measures are also important to protect the joint from further injury. Any time any of these exercises cause you to have increased pain or swelling, decrease what you are doing until you are comfortable again and then slowly increase them. If you have problems or questions, call your caregiver or physical therapist for advice.   Rehabilitation is important following a joint replacement. After just a few days of immobilization, the muscles of the leg can become weakened and shrink (atrophy).  These exercises are designed to build up the tone and strength of the thigh and leg muscles and to improve motion. Often times heat used for twenty to thirty minutes before working out will loosen up your tissues and help with improving the range of motion but do not use heat for the first two weeks following surgery (sometimes heat can increase post-operative swelling).   These exercises can be done on a training (exercise) mat, on the floor, on a table or on a bed. Use whatever works the best and  is most comfortable for you.    Use music or television while you are exercising so that the exercises are a pleasant break in your day. This will make your life better with the exercises acting as a break in your routine that you can look forward to.   Perform all exercises about fifteen times, three times per day or as directed.  You should exercise both the operative  leg and the other leg as well.  Exercises include:   Quad Sets - Tighten up the muscle on the front of the thigh (Quad) and hold for 5-10 seconds.   Straight Leg Raises - With your knee straight (if you were given a brace, keep it on), lift the leg to 60 degrees, hold for 3 seconds, and slowly lower the leg.  Perform this exercise against resistance later as your leg gets stronger.  Leg Slides: Lying on your back, slowly slide your foot toward your buttocks, bending your knee up off the floor (only go as far as is comfortable). Then slowly slide your foot back down until your leg is flat on the floor again.  Angel Wings: Lying on your back spread your legs to the side as far apart as you can without causing discomfort.  Hamstring Strength:  Lying on your back, push your heel against the floor with your leg straight by tightening up the muscles of your buttocks.  Repeat, but this time bend your knee to a comfortable angle, and push your heel against the floor.  You may put a pillow under the heel to make it more comfortable if necessary.   A rehabilitation program following joint replacement surgery can speed recovery and prevent re-injury in the future due to weakened muscles. Contact your doctor or a physical therapist for more information on knee rehabilitation.    CONSTIPATION  Constipation is defined medically as fewer than three stools per week and severe constipation as less than one stool per week.  Even if you have a regular bowel pattern at home, your normal regimen is likely to be disrupted due to multiple reasons following  surgery.  Combination of anesthesia, postoperative narcotics, change in appetite and fluid intake all can affect your bowels.   YOU MUST use at least one of the following options; they are listed in order of increasing strength to get the job done.  They are all available over the counter, and you may need to use some, POSSIBLY even all of these options:    Drink plenty of fluids (prune juice may be helpful) and high fiber foods Colace 100 mg by mouth twice a day  Senokot for constipation as directed and as needed Dulcolax (bisacodyl), take with full glass of water  Miralax (polyethylene glycol) once or twice a day as needed.  If you have tried all these things and are unable to have a bowel movement in the first 3-4 days after surgery call either your surgeon or your primary doctor.    If you experience loose stools or diarrhea, hold the medications until you stool forms back up.  If your symptoms do not get better within 1 week or if they get worse, check with your doctor.  If you experience "the worst abdominal pain ever" or develop nausea or vomiting, please contact the office immediately for further recommendations for treatment.   ITCHING:  If you experience itching with your medications, try taking only a single pain pill, or even half a pain pill at a time.  You can also use Benadryl over the counter for itching or also to help with sleep.   TED HOSE STOCKINGS:  Use stockings on both legs until for at least 2 weeks or as directed by physician office. They may be removed at night for sleeping.  MEDICATIONS:  See your medication summary on the "After Visit Summary" that nursing will review with you.  You may have some home medications  which will be placed on hold until you complete the course of blood thinner medication.  It is important for you to complete the blood thinner medication as prescribed.  PRECAUTIONS:  If you experience chest pain or shortness of breath - call 911 immediately  for transfer to the hospital emergency department.   If you develop a fever greater that 101 F, purulent drainage from wound, increased redness or drainage from wound, foul odor from the wound/dressing, or calf pain - CONTACT YOUR SURGEON.                                                   FOLLOW-UP APPOINTMENTS:  If you do not already have a post-op appointment, please call the office for an appointment to be seen by your surgeon.  Guidelines for how soon to be seen are listed in your "After Visit Summary", but are typically between 1-4 weeks after surgery.  OTHER INSTRUCTIONS:   Knee Replacement:  Do not place pillow under knee, focus on keeping the knee straight while resting. CPM instructions: 0-90 degrees, 2 hours in the morning, 2 hours in the afternoon, and 2 hours in the evening. Place foam block, curve side up under heel at all times except when in CPM or when walking.  DO NOT modify, tear, cut, or change the foam block in any way.   MAKE SURE YOU:  Understand these instructions.  Get help right away if you are not doing well or get worse.    Thank you for letting us be a part of your medical care team.  It is a privilege we respect greatly.  We hope these instructions will help you stay on track for a fast and full recovery!   Diagnostic Studies: DG Knee Right Port  Result Date: 03/02/2023 CLINICAL DATA:  Status post right knee replacement. EXAM: PORTABLE RIGHT KNEE - 1-2 VIEW COMPARISON:  None Available. FINDINGS: Right knee arthroplasty in expected alignment. No periprosthetic lucency or fracture. Recent postsurgical change includes air and edema in the soft tissues and joint space. IMPRESSION: Right knee arthroplasty without immediate postoperative complication. Electronically Signed   By: Narda Rutherford M.D.   On: 03/02/2023 17:59    Disposition: Discharge disposition: 01-Home or Self Care       Discharge Instructions     Call MD / Call 911   Complete by: As directed     If you experience chest pain or shortness of breath, CALL 911 and be transported to the hospital emergency room.  If you develope a fever above 101 F, pus (white drainage) or increased drainage or redness at the wound, or calf pain, call your surgeon's office.   Change dressing   Complete by: As directed    Do not remove your dressing.   Constipation Prevention   Complete by: As directed    Drink plenty of fluids.  Prune juice may be helpful.  You may use a stool softener, such as Colace (over the counter) 100 mg twice a day.  Use MiraLax (over the counter) for constipation as needed.   Diet - low sodium heart healthy   Complete by: As directed    Discharge instructions   Complete by: As directed    Elevate toes above nose. Use cryotherapy as needed for pain and swelling.   Do not put a pillow  under the knee. Place it under the heel.   Complete by: As directed    Driving restrictions   Complete by: As directed    No driving for 6 weeks   Increase activity slowly as tolerated   Complete by: As directed    Lifting restrictions   Complete by: As directed    No lifting for 6 weeks   Post-operative opioid taper instructions:   Complete by: As directed    POST-OPERATIVE OPIOID TAPER INSTRUCTIONS: It is important to wean off of your opioid medication as soon as possible. If you do not need pain medication after your surgery it is ok to stop day one. Opioids include: Codeine, Hydrocodone(Norco, Vicodin), Oxycodone(Percocet, oxycontin) and hydromorphone amongst others.  Long term and even short term use of opiods can cause: Increased pain response Dependence Constipation Depression Respiratory depression And more.  Withdrawal symptoms can include Flu like symptoms Nausea, vomiting And more Techniques to manage these symptoms Hydrate well Eat regular healthy meals Stay active Use relaxation techniques(deep breathing, meditating, yoga) Do Not substitute Alcohol to help with  tapering If you have been on opioids for less than two weeks and do not have pain than it is ok to stop all together.  Plan to wean off of opioids This plan should start within one week post op of your joint replacement. Maintain the same interval or time between taking each dose and first decrease the dose.  Cut the total daily intake of opioids by one tablet each day Next start to increase the time between doses. The last dose that should be eliminated is the evening dose.      TED hose   Complete by: As directed    Use stockings (TED hose) for 2 weeks on both leg(s).  You may remove them at night for sleeping.   Weight bearing as tolerated   Complete by: As directed         Follow-up Information     Clois Dupes, PA-C. Schedule an appointment as soon as possible for a visit in 2 week(s).   Specialty: Orthopedic Surgery Why: For suture removal, For wound re-check Contact information: [address] [place] [address] [phone]                  Signed: Clois Dupes 03/03/2023, 5:47 PM

## 2023-03-03 NOTE — Progress Notes (Signed)
    Subjective:  Patient reports pain as mild to moderate.  Denies N/V/CP/SOB/Abd pain. He denies any tingling or numbness in LE bilaterally. Catheter removed this morning.   Objective:   VITALS:   Vitals:   03/02/23 1845 03/02/23 2054 03/03/23 0132 03/03/23 0535  BP: 103/63 (!) 96/55 108/69 (!) 110/53  Pulse: 67 71 69 67  Resp: 17 17 17 17   Temp: 97.6 F (36.4 C) 98 F (36.7 C) 98.5 F (36.9 C) (!) 97.5 F (36.4 C)  TempSrc: Oral   Oral  SpO2: 100% 97% 98% 97%  Weight:      Height:        Patient lying in bed. NAD.  Neurologically intact ABD soft Neurovascular intact Sensation intact distally Intact pulses distally Dorsiflexion/Plantar flexion intact Incision: dressing C/D/I No cellulitis present Compartment soft   Lab Results  Component Value Date   WBC 10.2 03/03/2023   HGB 12.3 (L) 03/03/2023   HCT 38.2 (L) 03/03/2023   MCV 91.0 03/03/2023   PLT 272 03/03/2023   BMET    Component Value Date/Time   NA 140 03/03/2023 0349   K 4.8 03/03/2023 0349   CL 107 03/03/2023 0349   CO2 25 03/03/2023 0349   GLUCOSE 146 (H) 03/03/2023 0349   BUN 20 03/03/2023 0349   CREATININE 0.96 03/03/2023 0349   CALCIUM 8.7 (L) 03/03/2023 0349   GFRNONAA >60 03/03/2023 0349     Assessment/Plan: 1 Day Post-Op   Principal Problem:   Osteoarthritis of right knee Active Problems:   Degenerative arthritis of right knee   WBAT with walker DVT ppx: Aspirin, SCDs, TEDS PO pain control PT/OT: PT has not seen yet. PT to come by today.  Dispo: D/c home with OPPT once cleared with PT and voided.     Clois Dupes, PA-C 03/03/2023, 7:32 AM   [facility]  [place] [address], [address], [place], [place] [address] Phone: [phone] [email or web address] Facebook  [employer]

## 2023-03-03 NOTE — TOC Transition Note (Signed)
Transition of Care [facility]) - CM/SW Discharge Note  Patient Details  Name: Juan Long MRN: [id number] Date of Birth: 1975/09/26  Transition of Care [facility]) CM/SW Contact:  Ewing Schlein, LCSW Phone Number: 03/03/2023, 11:22 AM  Clinical Narrative: Patient is expected to discharge home after working with PT. CSW met with patient to confirm discharge plan. Patient will go home with OPPT at [facility] with the first appointment scheduled for 03/04/23. Patient has a rolling walker and ice machine he received through the [place], so there are no DME needs at this time. TOC signing off.  Final next level of care: OP Rehab Barriers to Discharge: No Barriers Identified  Patient Goals and CMS Choice Choice offered to / list presented to : NA  Discharge Plan and Services Additional resources added to the After Visit Summary for         DME Arranged: N/A DME Agency: NA  Social Determinants of Health (SDOH) Interventions SDOH Screenings   Food Insecurity: No Food Insecurity (03/02/2023)  Housing: Low Risk  (03/02/2023)  Transportation Needs: No Transportation Needs (03/02/2023)  Utilities: Not At Risk (03/02/2023)  Tobacco Use: Medium Risk (03/02/2023)   Readmission Risk Interventions     No data to display

## 2023-03-07 ENCOUNTER — Ambulatory Visit: Payer: Self-pay | Admitting: Student

## 2023-04-04 ENCOUNTER — Other Ambulatory Visit (HOSPITAL_COMMUNITY): Payer: Self-pay
